# Patient Record
Sex: Male | Born: 1990 | Race: White | Hispanic: No | Smoking: Current every day smoker
Health system: Southern US, Community
[De-identification: ages and names within clinical notes are randomized; demographics above are authoritative.]

## PROBLEM LIST (undated history)

## (undated) ENCOUNTER — Emergency Department (HOSPITAL_COMMUNITY): Admission: EM | Payer: Self-pay | Source: Home / Self Care

## (undated) HISTORY — PX: APPENDECTOMY: SHX54

---
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
## 2010-05-06 ENCOUNTER — Emergency Department: Payer: Self-pay | Admitting: Emergency Medicine

## 2010-06-11 ENCOUNTER — Emergency Department: Payer: Self-pay | Admitting: Emergency Medicine

## 2011-05-28 ENCOUNTER — Emergency Department: Payer: Self-pay | Admitting: Emergency Medicine

## 2012-04-20 ENCOUNTER — Emergency Department: Payer: Self-pay | Admitting: Emergency Medicine

## 2014-08-27 ENCOUNTER — Encounter (HOSPITAL_COMMUNITY): Payer: Self-pay | Admitting: *Deleted

## 2014-08-27 ENCOUNTER — Emergency Department (HOSPITAL_COMMUNITY)
Admission: EM | Admit: 2014-08-27 | Discharge: 2014-08-27 | Disposition: A | Discharge status: Home / Self Care | Payer: Self-pay | Attending: Emergency Medicine | Admitting: Emergency Medicine

## 2014-08-27 DIAGNOSIS — Z720 Tobacco use: Secondary | ICD-10-CM | POA: Insufficient documentation

## 2014-08-27 DIAGNOSIS — L0291 Cutaneous abscess, unspecified: Secondary | ICD-10-CM

## 2014-08-27 DIAGNOSIS — L02511 Cutaneous abscess of right hand: Secondary | ICD-10-CM | POA: Insufficient documentation

## 2014-08-27 MED ORDER — SULFAMETHOXAZOLE-TRIMETHOPRIM 800-160 MG PO TABS: 1.0000 | ORAL_TABLET | Freq: Two times a day (BID) | ORAL | Status: AC

## 2014-08-27 MED ORDER — LIDOCAINE HCL (PF) 2 % IJ SOLN
0.0000 mL | Freq: Once | INTRAMUSCULAR | Status: AC | PRN
  Administered 2014-08-27: 10 mL via INTRADERMAL
  Filled 2014-08-27: qty 10

## 2014-08-27 NOTE — ED Notes (Signed)
Patient c/o pain in finger states he noticed red swollen area yest.

## 2014-08-27 NOTE — ED Notes (Signed)
Refused wheelchair 

## 2014-08-27 NOTE — Discharge Instructions (Signed)
Return to the emergency room with worsening of symptoms, new symptoms or with symptoms that are concerning, especially fevers, chills, increased swelling, redness, pain, red streaks, pus discharge. Warm soaks three times a day, you may cover area with gauze. Please take all of your antibiotics until finished!   You may develop abdominal discomfort or diarrhea from the antibiotic.  You may help offset this with probiotics which you can buy or get in yogurt. Do not eat  or take the probiotics until 2 hours after your antibiotic.    Abscess Care After An abscess (also called a boil or furuncle) is an infected area that contains a collection of pus. Signs and symptoms of an abscess include pain, tenderness, redness, or hardness, or you may feel a moveable soft area under your skin. An abscess can occur anywhere in the body. The infection may spread to surrounding tissues causing cellulitis. A cut (incision) by the surgeon was made over your abscess and the pus was drained out. Gauze may have been packed into the space to provide a drain that will allow the cavity to heal from the inside outwards. The boil may be painful for 5 to 7 days. Most people with a boil do not have high fevers. Your abscess, if seen early, may not have localized, and may not have been lanced. If not, another appointment may be required for this if it does not get better on its own or with medications. HOME CARE INSTRUCTIONS   Only take over-the-counter or prescription medicines for pain, discomfort, or fever as directed by your caregiver.  When you bathe, soak and then remove gauze or iodoform packs at least daily or as directed by your caregiver. You may then wash the wound gently with mild soapy water. Repack with gauze or do as your caregiver directs. SEEK IMMEDIATE MEDICAL CARE IF:   You develop increased pain, swelling, redness, drainage, or bleeding in the wound site.  You develop signs of generalized infection including  muscle aches, chills, fever, or a general ill feeling.  An oral temperature above 102 F (38.9 C) develops, not controlled by medication. See your caregiver for a recheck if you develop any of the symptoms described above. If medications (antibiotics) were prescribed, take them as directed. Document Released: 04/16/2005 Document Revised: 12/21/2011 Document Reviewed: 12/12/2007 [facility] Patient Information 2015 [employer]Care, [place]. This information is not intended to replace advice given to you by your health care provider. Make sure you discuss any questions you have with your health care provider.

## 2014-08-27 NOTE — ED Provider Notes (Signed)
CSN: [id number]     Arrival date & time 08/27/14  1519 History  This chart was scribed for non-physician practitioner, Oswaldo ConroyVictoria Takasha Vetere, PA-C, working with Purvis SheffieldForrest Harrison, MD, by Modena JanskyAlbert Thayil, ED Scribe. This patient was seen in room TR05C/TR05C and the patient's care was started at 5:31 PM.  Chief Complaint  Patient presents with  . Abscess   The history is provided by the patient. No language interpreter was used.    HPI Comments: Colton DowseShawn W Hicks is a 23 y.o. male who presents to the Emergency Department complaining of an abscess on his right middle finger that started 5 days ago. He states that the abscess started off as a lump and has increased in size without any discharge. He reports that the abscess has some surrounding erythema. He reports that he has not been outside or had any insect bites. He states that he has been using hydrocortisone without any relief. He reports no prior hx of abscess. He denies any fever, chills, nausea, or emesis.  No history of diabetes.  History reviewed. No pertinent past medical history. No past surgical history on file. History reviewed. No pertinent family history. History  Substance Use Topics  . Smoking status: Current Every Day Smoker  . Smokeless tobacco: Not on file  . Alcohol Use: Not on file    Review of Systems  Constitutional: Negative for fever and chills.  Gastrointestinal: Negative for nausea and vomiting.  Skin: Positive for rash.  All other systems reviewed and are negative.     Allergies  Review of patient's allergies indicates no known allergies.  Home Medications   Prior to Admission medications   Medication Sig Start Date End Date Taking? Authorizing Provider  sulfamethoxazole-trimethoprim (BACTRIM DS,SEPTRA DS) 800-160 MG per tablet Take 1 tablet by mouth 2 (two) times daily. 08/27/14 09/03/14  Benetta SparVictoria L Sammuel Blick, PA-C   BP 136/83 mmHg  Pulse 66  Temp(Src) 97.6 F (36.4 C) (Oral)  Resp 18  Ht 5\' 9"  (1.753 m)  Wt  138 lb (62.596 kg)  BMI 20.37 kg/m2  SpO2 96% Physical Exam  Constitutional: He appears well-developed and well-nourished. No distress.  HENT:  Head: Normocephalic and atraumatic.  Eyes: Conjunctivae are normal. Right eye exhibits no discharge. Left eye exhibits no discharge.  Pulmonary/Chest: Effort normal. No respiratory distress.  Neurological: He is alert. Coordination normal.  Skin: He is not diaphoretic.  1-2 cm fluctuant mass to right middle finger distal to MCP joint. Mild surrounding erythema. Full flexion and extension. +2 radial pulses bilaterally. <2 cap. refill.   Nursing note and vitals reviewed.   ED Course  Procedures (including critical care time) DIAGNOSTIC STUDIES: Oxygen Saturation is 96% on RA, normal by my interpretation.    COORDINATION OF CARE: 5:35 PM- Pt advised of plan for treatment which includes medication and pt agrees.  Labs Review Labs Reviewed - No data to display  Imaging Review No results found.   EKG Interpretation None      INCISION AND DRAINAGE Performed by: Louann SjogrenVictoria L Takayla Baillie Consent: Verbal consent obtained. Risks and benefits: risks, benefits and alternatives were discussed Type: abscess  Body area: right dorsal middle finger  Anesthesia: local infiltration  Incision was made with a scalpel.  Local anesthetic: lidocaine 2% w/o epinephrine  Anesthetic total: 2 ml  Complexity: complex Blunt dissection to break up loculations  Drainage: purulent  Drainage amount: 1-2cc  Packing material: none  Patient tolerance: Patient tolerated the procedure well with no immediate complications.  Meds given in ED:  Medications  lidocaine (XYLOCAINE) 2 % injection 0-20 mL (10 mLs Intradermal Given 08/27/14 1806)    Discharge Medication List as of 08/27/2014  6:54 PM    START taking these medications   Details  sulfamethoxazole-trimethoprim (BACTRIM DS,SEPTRA DS) 800-160 MG per tablet Take 1 tablet by mouth 2 (two) times  daily., Starting 08/27/2014, Until Mon 09/03/14, Print          MDM   Final diagnoses:  Abscess   Patient with skin abscess amenable to incision and drainage.  Abscess was not large enough to warrant packing or drain,  wound recheck with urgent care as needed. Encouraged home warm soaks and flushing.  Mild signs of cellulitis is surrounding skin.  Will d/c to home.  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 abx provided.   Discussed return precautions with patient. Discussed all results and patient verbalizes understanding and agrees with plan.  I personally performed the services described in this documentation, which was scribed in my presence. The recorded information has been reviewed and is accurate.     Louann SjogrenVictoria L Asher Babilonia, PA-C 08/28/14 1220  Purvis SheffieldForrest Harrison, MD 08/28/14 1450

## 2014-08-27 NOTE — ED Notes (Signed)
Pt in c/o possible abscess to right middle finger for the last few days

## 2015-04-07 ENCOUNTER — Encounter (HOSPITAL_COMMUNITY): Payer: Self-pay | Admitting: Emergency Medicine

## 2015-04-07 ENCOUNTER — Emergency Department (HOSPITAL_COMMUNITY)
Admission: EM | Admit: 2015-04-07 | Discharge: 2015-04-07 | Disposition: A | Discharge status: Home / Self Care | Payer: Self-pay | Attending: Emergency Medicine | Admitting: Emergency Medicine

## 2015-04-07 DIAGNOSIS — M436 Torticollis: Secondary | ICD-10-CM | POA: Insufficient documentation

## 2015-04-07 DIAGNOSIS — Z720 Tobacco use: Secondary | ICD-10-CM | POA: Insufficient documentation

## 2015-04-07 MED ORDER — METHOCARBAMOL 500 MG PO TABS: 500.0000 mg | ORAL_TABLET | Freq: Two times a day (BID) | ORAL | Status: DC

## 2015-04-07 MED ORDER — NAPROXEN 500 MG PO TABS: 500.0000 mg | ORAL_TABLET | Freq: Two times a day (BID) | ORAL | Status: DC

## 2015-04-07 MED ORDER — METHOCARBAMOL 500 MG PO TABS
500.0000 mg | ORAL_TABLET | Freq: Once | ORAL | Status: AC
  Administered 2015-04-07: 500 mg via ORAL
  Filled 2015-04-07: qty 1

## 2015-04-07 NOTE — Discharge Instructions (Signed)
Please follow up with your primary care physician in 1-2 days. If you do not have one please call the [facility] number listed above. Please take pain medication and/or muscle relaxants as prescribed and as needed for pain. Please do not drive on narcotic pain medication or on muscle relaxants. Please read all discharge instructions and return precautions.  ° °Torticollis, Acute °You have suddenly (acutely) developed a twisted neck (torticollis). This is usually a self-limited condition. °CAUSES  °Acute torticollis may be caused by malposition, trauma or infection. Most commonly, acute torticollis is caused by sleeping in an awkward position. Torticollis may also be caused by the flexion, extension or twisting of the neck muscles beyond their normal position. Sometimes, the exact cause may not be known. °SYMPTOMS  °Usually, there is pain and limited movement of the neck. Your neck may twist to one side. °DIAGNOSIS  °The diagnosis is often made by physical examination. X-rays, CT scans or MRIs may be done if there is a history of trauma or concern of infection. °TREATMENT  °For a common, stiff neck that develops during sleep, treatment is focused on relaxing the contracted neck muscle. Medications (including shots) may be used to treat the problem. Most cases resolve in several days. Torticollis usually responds to conservative physical therapy. If left untreated, the shortened and spastic neck muscle can cause deformities in the face and neck. Rarely, surgery is required. °HOME CARE INSTRUCTIONS  °· Use over-the-counter and prescription medications as directed by your caregiver. °· Do stretching exercises and massage the neck as directed by your caregiver. °· Follow up with physical therapy if needed and as directed by your caregiver. °SEEK IMMEDIATE MEDICAL CARE IF:  °· You develop difficulty breathing or noisy breathing (stridor). °· You drool, develop trouble swallowing or have pain with  swallowing. °· You develop numbness or weakness in the hands or feet. °· You have changes in speech or vision. °· You have problems with urination or bowel movements. °· You have difficulty walking. °· You have a fever. °· You have increased pain. °MAKE SURE YOU:  °· Understand these instructions. °· Will watch your condition. °· Will get help right away if you are not doing well or get worse. °Document Released: 09/25/2000 Document Revised: 12/21/2011 Document Reviewed: 11/06/2009 °ExitCare® Patient Information ©2015 Exit[employer], LLC. This information is not intended to replace advice given to you by your health care provider. Make sure you discuss any questions you have with your health care provider. ° °

## 2015-04-07 NOTE — ED Notes (Signed)
C/o pain to posterior neck that he woke up with yesterday morning.  No known injury.

## 2015-04-07 NOTE — ED Provider Notes (Signed)
History  This chart was scribed for non-physician practitioner, Francee Piccolo, PA-C,working with Elwin Mocha, MD, by Karle Plumber, ED Scribe. This patient was seen in room TR06C/TR06C and the patient's care was started at 5:03 PM.  Chief Complaint  Patient presents with  . Neck Pain   The history is provided by the patient and medical records. No language interpreter was used.    HPI Comments:  Colton Hicks is a 24 y.o. male who presents to the Emergency Department complaining of severe posterior neck pain that radiates to bilateral shoulders and upper back that began yesterday upon waking. He states he has had similar symptoms in the past that resolved on its own. He states he has taken Ibuprofen with no significant relief of the pain. Pt denies modifying factors of the symptoms. Denies fever, chills, rashes, nausea, vomiting, numbness, tingling or weakness of the upper extremities, trauma, injury or fall. He denies any known tick bites.  History reviewed. No pertinent past medical history. History reviewed. No pertinent past surgical history. No family history on file. History  Substance Use Topics  . Smoking status: Current Every Day Smoker  . Smokeless tobacco: Not on file  . Alcohol Use: No    Review of Systems  Constitutional: Negative for fever and chills.  Gastrointestinal: Negative for nausea and vomiting.  Musculoskeletal: Positive for myalgias and neck pain.  Skin: Negative for rash and wound.  Neurological: Negative for weakness and numbness.  All other systems reviewed and are negative.   Allergies  Review of patient's allergies indicates no known allergies.  Home Medications   Prior to Admission medications   Medication Sig Start Date End Date Taking? Authorizing Provider  methocarbamol (ROBAXIN) 500 MG tablet Take 1 tablet (500 mg total) by mouth 2 (two) times daily. 04/07/15   Mahsa Hanser, PA-C  naproxen (NAPROSYN) 500 MG tablet Take 1  tablet (500 mg total) by mouth 2 (two) times daily with a meal. 04/07/15   Francee Piccolo, PA-C   Triage Vitals: BP 128/82 mmHg  Pulse 58  Temp(Src) 98.1 F (36.7 C) (Oral)  Resp 16  Ht  (1.727 m)  Wt 139 lb (63.05 kg)  BMI 21.14 kg/m2  SpO2 97% Physical Exam  Constitutional: He is oriented to person, place, and time. He appears well-developed and well-nourished. No distress.  HENT:  Head: Normocephalic and atraumatic.  Right Ear: External ear normal.  Left Ear: External ear normal.  Nose: Nose normal.  Mouth/Throat: Oropharynx is clear and moist.  Eyes: Conjunctivae and EOM are normal. Pupils are equal, round, and reactive to light.  Neck: Normal range of motion. Neck supple. Muscular tenderness present. No spinous process tenderness present.  No nuchal rigidity.   Cardiovascular: Normal rate, regular rhythm and normal heart sounds.   Pulmonary/Chest: Effort normal.  Abdominal: Soft.  Musculoskeletal: Normal range of motion.       Cervical back: He exhibits spasm. He exhibits no bony tenderness and no deformity.  Neurological: He is alert and oriented to person, place, and time.  Skin: Skin is warm and dry. He is not diaphoretic.  Psychiatric: He has a normal mood and affect.  Nursing note and vitals reviewed.   ED Course  Procedures (including critical care time) Medications  methocarbamol (ROBAXIN) tablet 500 mg (500 mg Oral Given 04/07/15 1719)    DIAGNOSTIC STUDIES: Oxygen Saturation is 97% on RA, normal by my interpretation.   COORDINATION OF CARE: 5:06 PM- Will prescribe muscle relaxer and encouraged pt to  continue Ibuprofen. Will order first dose here. Pt verbalizes understanding and agrees to plan.  Medications  methocarbamol (ROBAXIN) tablet 500 mg (500 mg Oral Given 04/07/15 1719)   Labs Review Labs Reviewed - No data to display  Imaging Review No results found.   EKG Interpretation None      MDM   Final diagnoses:  Torticollis     Filed Vitals:   04/07/15 1652  BP: 128/82  Pulse: 58  Temp: 98.1 F (36.7 C)  Resp: 16   Afebrile, NAD, non-toxic appearing, AAOx4.  Patient presenting for bilateral neck pain with muscle spasm noted. No cervical spine tenderness or deformity. Pain likely related to muscle spasm. Will prescribe muscle relaxer. Return precautions discussed. Patient is agreeable to plan. Patient is stable at time of discharge   I personally performed the services described in this documentation, which was scribed in my presence. The recorded information has been reviewed and is accurate.  Francee Piccolo, PA-C 04/07/15 1726  Elwin Mocha, MD 04/07/15 2329

## 2015-10-19 ENCOUNTER — Emergency Department (HOSPITAL_COMMUNITY): Payer: 59

## 2015-10-19 ENCOUNTER — Emergency Department (HOSPITAL_COMMUNITY)
Admission: EM | Admit: 2015-10-19 | Discharge: 2015-10-19 | Disposition: A | Discharge status: Home / Self Care | Payer: 59 | Attending: Emergency Medicine | Admitting: Emergency Medicine

## 2015-10-19 ENCOUNTER — Encounter (HOSPITAL_COMMUNITY): Payer: Self-pay | Admitting: *Deleted

## 2015-10-19 DIAGNOSIS — R091 Pleurisy: Secondary | ICD-10-CM | POA: Insufficient documentation

## 2015-10-19 DIAGNOSIS — R1110 Vomiting, unspecified: Secondary | ICD-10-CM | POA: Diagnosis present

## 2015-10-19 DIAGNOSIS — K529 Noninfective gastroenteritis and colitis, unspecified: Secondary | ICD-10-CM | POA: Insufficient documentation

## 2015-10-19 DIAGNOSIS — R1111 Vomiting without nausea: Secondary | ICD-10-CM | POA: Diagnosis not present

## 2015-10-19 DIAGNOSIS — F17200 Nicotine dependence, unspecified, uncomplicated: Secondary | ICD-10-CM | POA: Diagnosis not present

## 2015-10-19 DIAGNOSIS — K2970 Gastritis, unspecified, without bleeding: Secondary | ICD-10-CM | POA: Diagnosis not present

## 2015-10-19 DIAGNOSIS — R109 Unspecified abdominal pain: Secondary | ICD-10-CM | POA: Diagnosis not present

## 2015-10-19 DIAGNOSIS — R079 Chest pain, unspecified: Secondary | ICD-10-CM | POA: Diagnosis not present

## 2015-10-19 LAB — CBC WITH DIFFERENTIAL/PLATELET
Basophils Absolute: 0 10*3/uL (ref 0.0–0.1)
Basophils Relative: 0 %
Eosinophils Absolute: 0.1 10*3/uL (ref 0.0–0.7)
Eosinophils Relative: 1 %
HEMATOCRIT: 49.6 % (ref 39.0–52.0)
HEMOGLOBIN: 17.7 g/dL — AB (ref 13.0–17.0)
LYMPHS ABS: 0.6 10*3/uL — AB (ref 0.7–4.0)
LYMPHS PCT: 4 %
MCH: 29.1 pg (ref 26.0–34.0)
MCHC: 35.7 g/dL (ref 30.0–36.0)
MCV: 81.6 fL (ref 78.0–100.0)
Monocytes Absolute: 0.7 10*3/uL (ref 0.1–1.0)
Monocytes Relative: 5 %
NEUTROS ABS: 12.9 10*3/uL — AB (ref 1.7–7.7)
Neutrophils Relative %: 90 %
PLATELETS: 199 10*3/uL (ref 150–400)
RBC: 6.08 MIL/uL — ABNORMAL HIGH (ref 4.22–5.81)
RDW: 12.4 % (ref 11.5–15.5)
WBC: 14.3 10*3/uL — ABNORMAL HIGH (ref 4.0–10.5)

## 2015-10-19 LAB — COMPREHENSIVE METABOLIC PANEL
ALT: 32 U/L (ref 17–63)
AST: 30 U/L (ref 15–41)
Albumin: 4.9 g/dL (ref 3.5–5.0)
Alkaline Phosphatase: 48 U/L (ref 38–126)
Anion gap: 14 (ref 5–15)
BUN: 13 mg/dL (ref 6–20)
CHLORIDE: 103 mmol/L (ref 101–111)
CO2: 24 mmol/L (ref 22–32)
Calcium: 9.6 mg/dL (ref 8.9–10.3)
Creatinine, Ser: 1.13 mg/dL (ref 0.61–1.24)
GFR calc Af Amer: >60 mL/min (ref >60)
Glucose, Bld: 100 mg/dL — ABNORMAL HIGH (ref 65–99)
POTASSIUM: 3.7 mmol/L (ref 3.5–5.1)
Sodium: 141 mmol/L (ref 135–145)
Total Bilirubin: 1.7 mg/dL — ABNORMAL HIGH (ref 0.3–1.2)
Total Protein: 7.1 g/dL (ref 6.5–8.1)

## 2015-10-19 LAB — URINALYSIS, ROUTINE W REFLEX MICROSCOPIC
GLUCOSE, UA: NEGATIVE mg/dL
Hgb urine dipstick: NEGATIVE
Ketones, ur: 40 mg/dL — AB
LEUKOCYTES UA: NEGATIVE
Nitrite: NEGATIVE
Protein, ur: NEGATIVE mg/dL
Specific Gravity, Urine: 1.028 (ref 1.005–1.030)
pH: 5.5 (ref 5.0–8.0)

## 2015-10-19 LAB — LIPASE, BLOOD: LIPASE: 30 U/L (ref 11–51)

## 2015-10-19 MED ORDER — HYDROCODONE-ACETAMINOPHEN 5-325 MG PO TABS
1.0000 | ORAL_TABLET | Freq: Once | ORAL | Status: AC
  Administered 2015-10-19: 1 via ORAL
  Filled 2015-10-19: qty 1

## 2015-10-19 MED ORDER — KETOROLAC TROMETHAMINE 30 MG/ML IJ SOLN
30.0000 mg | Freq: Once | INTRAMUSCULAR | Status: AC
  Administered 2015-10-19: 30 mg via INTRAVENOUS
  Filled 2015-10-19: qty 1

## 2015-10-19 MED ORDER — ONDANSETRON 4 MG PO TBDP: 4.0000 mg | ORAL_TABLET | Freq: Once | ORAL | Status: DC

## 2015-10-19 MED ORDER — ONDANSETRON 4 MG PO TBDP
4.0000 mg | ORAL_TABLET | Freq: Once | ORAL | Status: AC
  Administered 2015-10-19: 4 mg via ORAL
  Filled 2015-10-19: qty 1

## 2015-10-19 MED ORDER — NAPROXEN 375 MG PO TABS: 375.0000 mg | ORAL_TABLET | Freq: Two times a day (BID) | ORAL | Status: DC

## 2015-10-19 NOTE — ED Provider Notes (Signed)
CSN: [id number]     Arrival date & time 10/19/15  0347 History  By signing my name below, I, Bethel Born, attest that this documentation has been prepared under the direction and in the presence of Derwood Kaplan, MD. Electronically Signed: Bethel Born, ED Scribe. 10/19/2015. 4:08 AM   Chief Complaint  Patient presents with  . Emesis     The history is provided by the patient. No language interpreter was used.   Brought in by EMS, Colton Hicks is a 25 y.o. male who presents to the Emergency Department complaining of new, atraumatic, constant, diffuse abdominal pain with onset 10 hours ago. Associated symptoms include N/V/D and extremity cramping. Pt notes 3 episodes of loose stool and 6-7 episodes of yellow/white emesis. Pt denies hematemesis and hematochezia. He denies eating anything suspicious. He has no relevant surgical history. Pt is otherwise healthy and takes no daily medication. He denies tobacco and heavy alcohol use.   History reviewed. No pertinent past medical history. History reviewed. No pertinent past surgical history. No family history on file. Social History  Substance Use Topics  . Smoking status: Current Every Day Smoker  . Smokeless tobacco: None  . Alcohol Use: No    Review of Systems  Gastrointestinal: Positive for nausea, vomiting, abdominal pain and diarrhea.  Musculoskeletal: Positive for myalgias.   10 Systems reviewed and all are negative for acute change except as noted in the HPI.  Allergies  Review of patient's allergies indicates no known allergies.  Home Medications   Prior to Admission medications   Medication Sig Start Date End Date Taking? Authorizing Provider  naproxen (NAPROSYN) 375 MG tablet Take 1 tablet (375 mg total) by mouth 2 (two) times daily. 10/19/15   Derwood Kaplan, MD  ondansetron (ZOFRAN-ODT) 4 MG disintegrating tablet Take 1 tablet (4 mg total) by mouth once. 10/19/15   Larson Limones, MD   BP 120/101 mmHg  Pulse 82   Temp(Src) 97.8 F (36.6 C)  Resp 24  Ht 5\' 9"  (1.753 m)  Wt 140 lb (63.504 kg)  BMI 20.67 kg/m2  SpO2 100% Physical Exam  Constitutional: He is oriented to person, place, and time. He appears well-developed and well-nourished.  HENT:  Head: Normocephalic and atraumatic.  Moist mucous membranes.  Eyes: EOM are normal.  Neck: Normal range of motion.  Cardiovascular: Normal rate, regular rhythm, normal heart sounds and intact distal pulses.   Pulmonary/Chest: Effort normal and breath sounds normal. No respiratory distress.  Abdominal: Soft. He exhibits no distension. Bowel sounds are increased. There is tenderness.  Tenderness over the LUQ and LLQ. No flank tenderness.  Musculoskeletal: Normal range of motion.  Neurological: He is alert and oriented to person, place, and time.  Skin: Skin is warm and dry.  Capillary refill less than 3 seconds.   Psychiatric: He has a normal mood and affect. Judgment normal.  Nursing note and vitals reviewed.   ED Course  Procedures (including critical care time) DIAGNOSTIC STUDIES: Oxygen Saturation is 100% on RA,  normal by my interpretation.    COORDINATION OF CARE: 4:02 AM Discussed treatment plan with pt at bedside and pt agreed to plan.  Labs Review Labs Reviewed  CBC WITH DIFFERENTIAL/PLATELET - Abnormal; Notable for the following:    WBC 14.3 (*)    RBC 6.08 (*)    Hemoglobin 17.7 (*)    Neutro Abs 12.9 (*)    Lymphs Abs 0.6 (*)    All other components within normal limits  COMPREHENSIVE METABOLIC  PANEL - Abnormal; Notable for the following:    Glucose, Bld 100 (*)    Total Bilirubin 1.7 (*)    All other components within normal limits  URINALYSIS, ROUTINE W REFLEX MICROSCOPIC (NOT [facility]) - Abnormal; Notable for the following:    Color, Urine AMBER (*)    Bilirubin Urine SMALL (*)    Ketones, ur 40 (*)    All other components within normal limits  LIPASE, BLOOD    Imaging Review Dg Abd Acute W/chest  10/19/2015  CLINICAL  DATA:  Chest and abdominal pain, nausea and vomiting, diarrhea beginning yesterday. EXAM: DG ABDOMEN ACUTE W/ 1V CHEST COMPARISON:  None. FINDINGS: Cardiomediastinal silhouette is normal. Lungs are clear, no pleural effusions. No pneumothorax. Soft tissue planes and included osseous structures are nonacute; dystrophic calcifications projecting at base of neck. Bowel gas pattern is nondilated and nonobstructive. No intra-abdominal mass effect, pathologic calcifications or free air. Soft tissue planes and included osseous structures are non-suspicious. IMPRESSION: Negative abdominal radiographs. No acute cardiopulmonary process. Dystrophic calcifications projecting at base of neck could be external to the patient or, possibly within the thyroid gland. Electronically Signed   By: Awilda Metroourtnay  Bloomer M.D.   On: 10/19/2015 05:04      EKG Interpretation None      MDM   Final diagnoses:  Pleurisy  Gastroenteritis    I personally performed the services described in this documentation, which was scribed in my presence. The recorded information has been reviewed and is accurate.  Young man with no medical hx, cardiopulm hx comes in with chest pain and abd pain. - Exam is non peritoneal. N/v/abd pain and diarrhea - likely gastro. Appears slightly dry. Oral challenge initiated. - Pt has no hx of PE, DVT and denies any exogenous estrogen use, long distance travels or surgery in the past 6 weeks, active cancer, recent immobilization. Pain is pleuritic. No cough however. AAS ordered - and shows no obstructive pathology or infiltrates. Pt is PERC neg. Will tx as pleurisy or costochondritis for the right sided chest pain.    Derwood KaplanAnkit Laniya Friedl, MD 10/21/15 [phone]

## 2015-10-19 NOTE — Discharge Instructions (Signed)
We think you have a stomach flu.  Take the nausea medicine, hydrate well and start on clear liquid diet. Please return to the ER if you have bloody stools, fevers.  You also complained rib pain on the right side, worse with breathing. All of our cardiac workup is normal, including labs, EKG and chest X-RAY are normal. We are not sure what is causing your discomfort, but we feel comfortable sending you home at this time.  Please return to the ER if you have worsening chest pain, shortness of breath.   Pleurisy Pleurisy is an inflammation and swelling of the lining of the lungs (pleura). Because of this inflammation, it hurts to breathe. It can be aggravated by coughing, laughing, or deep breathing. Pleurisy is often caused by an underlying infection or disease.  HOME CARE INSTRUCTIONS  Monitor your pleurisy for any changes. The following actions may help to alleviate any discomfort you are experiencing:  Medicine may help with pain. Only take over-the-counter or prescription medicines for pain, discomfort, or fever as directed by your health care provider.  Only take antibiotic medicine as directed. Make sure to finish it even if you start to feel better. SEEK MEDICAL CARE IF:   Your pain is not controlled with medicine or is increasing.  You have an increase in pus-like (purulent) secretions brought up with coughing. SEEK IMMEDIATE MEDICAL CARE IF:   You have blue or dark lips, fingernails, or toenails.  You are coughing up blood.  You have increased difficulty breathing.  You have continuing pain unrelieved by medicine or pain lasting more than 1 week.  You have pain that radiates into your neck, arms, or jaw.  You develop increased shortness of breath or wheezing.  You develop a fever, rash, vomiting, fainting, or other serious symptoms. MAKE SURE YOU:  Understand these instructions.   Will watch your condition.   Will get help right away if you are not doing well or  get worse.    This information is not intended to replace advice given to you by your health care provider. Make sure you discuss any questions you have with your health care provider.   Document Released: 09/28/2005 Document Revised: 05/31/2013 Document Reviewed: 03/12/2013 Elsevier Interactive Patient Education 2016 [employer] Viral Gastroenteritis Viral gastroenteritis is also known as stomach flu. This condition affects the stomach and intestinal tract. It can cause sudden diarrhea and vomiting. The illness typically lasts 3 to 8 days. Most people develop an immune response that eventually gets rid of the virus. While this natural response develops, the virus can make you quite ill. CAUSES  Many different viruses can cause gastroenteritis, such as rotavirus or noroviruses. You can catch one of these viruses by consuming contaminated food or water. You may also catch a virus by sharing utensils or other personal items with an infected person or by touching a contaminated surface. SYMPTOMS  The most common symptoms are diarrhea and vomiting. These problems can cause a severe loss of body fluids (dehydration) and a body salt (electrolyte) imbalance. Other symptoms may include:  Fever.  Headache.  Fatigue.  Abdominal pain. DIAGNOSIS  Your caregiver can usually diagnose viral gastroenteritis based on your symptoms and a physical exam. A stool sample may also be taken to test for the presence of viruses or other infections. TREATMENT  This illness typically goes away on its own. Treatments are aimed at rehydration. The most serious cases of viral gastroenteritis involve vomiting so severely that you are not able  to keep fluids down. In these cases, fluids must be given through an intravenous line (IV). HOME CARE INSTRUCTIONS   Drink enough fluids to keep your urine clear or pale yellow. Drink small amounts of fluids frequently and increase the amounts as tolerated.  Ask your caregiver  for specific rehydration instructions.  Avoid:  Foods high in sugar.  Alcohol.  Carbonated drinks.  Tobacco.  Juice.  Caffeine drinks.  Extremely hot or cold fluids.  Fatty, greasy foods.  Too much intake of anything at one time.  Dairy products until 24 to 48 hours after diarrhea stops.  You may consume probiotics. Probiotics are active cultures of beneficial bacteria. They may lessen the amount and number of diarrheal stools in adults. Probiotics can be found in yogurt with active cultures and in supplements.  Wash your hands well to avoid spreading the virus.  Only take over-the-counter or prescription medicines for pain, discomfort, or fever as directed by your caregiver. Do not give aspirin to children. Antidiarrheal medicines are not recommended.  Ask your caregiver if you should continue to take your regular prescribed and over-the-counter medicines.  Keep all follow-up appointments as directed by your caregiver. SEEK IMMEDIATE MEDICAL CARE IF:   You are unable to keep fluids down.  You do not urinate at least once every 6 to 8 hours.  You develop shortness of breath.  You notice blood in your stool or vomit. This may look like coffee grounds.  You have abdominal pain that increases or is concentrated in one small area (localized).  You have persistent vomiting or diarrhea.  You have a fever.  The patient is a child younger than 3 months, and he or she has a fever.  The patient is a child older than 3 months, and he or she has a fever and persistent symptoms.  The patient is a child older than 3 months, and he or she has a fever and symptoms suddenly get worse.  The patient is a baby, and he or she has no tears when crying. MAKE SURE YOU:   Understand these instructions.  Will watch your condition.  Will get help right away if you are not doing well or get worse.   This information is not intended to replace advice given to you by your health  care provider. Make sure you discuss any questions you have with your health care provider.   Document Released: 09/28/2005 Document Revised: 12/21/2011 Document Reviewed: 07/15/2011 Elsevier Interactive Patient Education [employer]

## 2015-10-19 NOTE — ED Notes (Signed)
The pt arrived by gems from home.  He has had nausea vomiting diarrhea since 1830 tonight.  Other people in household are ill.  Iv per ems  zofran 4mg  given by gems.  Pt alert no distress.  He is c/o some cramps in his hands rapid respirations.  cbg 120

## 2015-10-19 NOTE — ED Notes (Signed)
To x-ray

## 2016-10-19 ENCOUNTER — Ambulatory Visit (HOSPITAL_COMMUNITY)
Admission: EM | Admit: 2016-10-19 | Discharge: 2016-10-19 | Disposition: A | Discharge status: Home / Self Care | Payer: 59 | Attending: Emergency Medicine | Admitting: Emergency Medicine

## 2016-10-19 ENCOUNTER — Encounter (HOSPITAL_COMMUNITY): Payer: Self-pay | Admitting: Emergency Medicine

## 2016-10-19 DIAGNOSIS — N509 Disorder of male genital organs, unspecified: Secondary | ICD-10-CM | POA: Diagnosis not present

## 2016-10-19 DIAGNOSIS — N5089 Other specified disorders of the male genital organs: Secondary | ICD-10-CM

## 2016-10-19 MED ORDER — DOXYCYCLINE HYCLATE 100 MG PO CAPS: 100.0000 mg | ORAL_CAPSULE | Freq: Two times a day (BID) | ORAL | 0 refills | Status: DC

## 2016-10-19 MED ORDER — HYDROCODONE-ACETAMINOPHEN 5-325 MG PO TABS: 2.0000 | ORAL_TABLET | ORAL | 0 refills | Status: DC | PRN

## 2016-10-19 NOTE — ED Triage Notes (Signed)
[facility] a [facility] possible abscess in his groin area that has been there for 4 days.

## 2016-10-19 NOTE — ED Provider Notes (Signed)
CSN: [id number]     Arrival date & time 10/19/16  1535 History   None    Chief Complaint  Patient presents with  . Abscess   (Consider location/radiation/quality/duration/timing/severity/associated sxs/prior Treatment) The history is provided by the patient. No language interpreter was used.  Abscess  Abscess location: scrotum. Size:  2 Abscess quality: painful   Red streaking: no   Progression:  Worsening Pain details:    Quality:  No pain   Severity:  No pain   Timing:  Constant   Progression:  Worsening Chronicity:  New Relieved by:  Nothing Worsened by:  Nothing Ineffective treatments:  None tried Risk factors: prior abscess   Pt complains of a swollen area on his anterior scrotum.  Pt reports he noticed 4 days ago.  Pt has had skin infections in the past  History reviewed. No pertinent past medical history. History reviewed. No pertinent surgical history. History reviewed. No pertinent family history. Social History  Substance Use Topics  . Smoking status: Current Every Day Smoker  . Smokeless tobacco: Never Used  . Alcohol use No    Review of Systems  All other systems reviewed and are negative.   Allergies  Patient has no known allergies.  Home Medications   Prior to Admission medications   Medication Sig Start Date End Date Taking? Authorizing Provider  doxycycline (VIBRAMYCIN) 100 MG capsule Take 1 capsule (100 mg total) by mouth 2 (two) times daily. 10/19/16   Elson Areas, PA-C  HYDROcodone-acetaminophen (NORCO/VICODIN) 5-325 MG tablet Take 2 tablets by mouth every 4 (four) hours as needed. 10/19/16   Elson Areas, PA-C   Meds Ordered and Administered this Visit  Medications - No data to display  BP 121/72 (BP Location: Left Arm)   Pulse 63   Temp 98.1 F (36.7 C) (Oral)   Resp 16   SpO2 99%  No data found.   Physical Exam  Constitutional: He is oriented to person, place, and time. He appears well-developed and well-nourished.  HENT:  Head:  Normocephalic.  Eyes: EOM are normal.  Neck: Normal range of motion.  Pulmonary/Chest: Effort normal.  Abdominal: He exhibits no distension.  Genitourinary:  Genitourinary Comments: 2cm firm area anterior mid upper scrotum,  frim to palpation  Musculoskeletal: Normal range of motion.  Neurological: He is alert and oriented to person, place, and time.  Skin: Skin is warm.  Psychiatric: He has a normal mood and affect.  Nursing note and vitals reviewed.   Urgent Care Course   Clinical Course     Procedures (including critical care time)  Labs Review Labs Reviewed - No data to display  Imaging Review No results found.   Visual Acuity Review  Right Eye Distance:   Left Eye Distance:   Bilateral Distance:    Right Eye Near:   Left Eye Near:    Bilateral Near:         MDM  Exam with Rn in room.  I counseled pt to schedule to see the Urologist.  Area may be infection.  Pt advised to soak area 20 minutes 4 times a day.     1. Scrotal mass     Meds ordered this encounter  Medications  . DISCONTD: doxycycline (VIBRAMYCIN) 100 MG capsule    Sig: Take 1 capsule (100 mg total) by mouth 2 (two) times daily.    Dispense:  20 capsule    Refill:  0    Order Specific Question:   Supervising Provider  AnswerCharm Rings:   HONIG, ERIN J [[id number]]  . HYDROcodone-acetaminophen (NORCO/VICODIN) 5-325 MG tablet    Sig: Take 2 tablets by mouth every 4 (four) hours as needed.    Dispense:  16 tablet    Refill:  0    Order Specific Question:   Supervising Provider    Answer:   Charm RingsHONIG, ERIN J [id number]]  . doxycycline (VIBRAMYCIN) 100 MG capsule    Sig: Take 1 capsule (100 mg total) by mouth 2 (two) times daily.    Dispense:  20 capsule    Refill:  0    Order Specific Question:   Supervising Provider    Answer:   Charm RingsHONIG, ERIN J [phone][[id number]]  An After Visit Summary was printed and given to the patient.   Lonia SkinnerLeslie K PittsvilleSofia, PA-C 10/19/16 [id number]

## 2016-11-14 ENCOUNTER — Encounter (HOSPITAL_COMMUNITY): Payer: Self-pay | Admitting: Emergency Medicine

## 2016-11-14 ENCOUNTER — Encounter (HOSPITAL_COMMUNITY)
Admission: EM | Disposition: A | Discharge status: Home / Self Care | Payer: Self-pay | Source: Home / Self Care | Attending: Emergency Medicine

## 2016-11-14 ENCOUNTER — Observation Stay (HOSPITAL_COMMUNITY)
Admission: EM | Admit: 2016-11-14 | Discharge: 2016-11-15 | Disposition: A | Discharge status: Home / Self Care | Payer: 59 | Attending: Surgery | Admitting: Surgery

## 2016-11-14 ENCOUNTER — Observation Stay (HOSPITAL_COMMUNITY): Payer: 59 | Admitting: Anesthesiology

## 2016-11-14 ENCOUNTER — Emergency Department (HOSPITAL_COMMUNITY): Payer: 59

## 2016-11-14 DIAGNOSIS — K381 Appendicular concretions: Secondary | ICD-10-CM | POA: Insufficient documentation

## 2016-11-14 DIAGNOSIS — R109 Unspecified abdominal pain: Secondary | ICD-10-CM | POA: Diagnosis not present

## 2016-11-14 DIAGNOSIS — R1110 Vomiting, unspecified: Secondary | ICD-10-CM | POA: Diagnosis not present

## 2016-11-14 DIAGNOSIS — K3580 Unspecified acute appendicitis: Principal | ICD-10-CM | POA: Diagnosis present

## 2016-11-14 DIAGNOSIS — F17210 Nicotine dependence, cigarettes, uncomplicated: Secondary | ICD-10-CM | POA: Diagnosis not present

## 2016-11-14 HISTORY — PX: LAPAROSCOPIC APPENDECTOMY: SHX408

## 2016-11-14 LAB — COMPREHENSIVE METABOLIC PANEL
ALK PHOS: 50 U/L (ref 38–126)
ALT: 30 U/L (ref 17–63)
AST: 26 U/L (ref 15–41)
Albumin: 4.6 g/dL (ref 3.5–5.0)
Anion gap: 11 (ref 5–15)
BILIRUBIN TOTAL: 0.7 mg/dL (ref 0.3–1.2)
BUN: 6 mg/dL (ref 6–20)
CALCIUM: 9.6 mg/dL (ref 8.9–10.3)
CO2: 22 mmol/L (ref 22–32)
CREATININE: 1.09 mg/dL (ref 0.61–1.24)
Chloride: 104 mmol/L (ref 101–111)
GFR calc non Af Amer: >60 mL/min (ref >60)
Glucose, Bld: 123 mg/dL — ABNORMAL HIGH (ref 65–99)
Potassium: 3.8 mmol/L (ref 3.5–5.1)
Sodium: 137 mmol/L (ref 135–145)
TOTAL PROTEIN: 7.3 g/dL (ref 6.5–8.1)

## 2016-11-14 LAB — CBC
HCT: 47 % (ref 39.0–52.0)
Hemoglobin: 16.2 g/dL (ref 13.0–17.0)
MCH: 27.6 pg (ref 26.0–34.0)
MCHC: 34.5 g/dL (ref 30.0–36.0)
MCV: 80.2 fL (ref 78.0–100.0)
Platelets: 207 10*3/uL (ref 150–400)
RBC: 5.86 MIL/uL — AB (ref 4.22–5.81)
RDW: 12.2 % (ref 11.5–15.5)
WBC: 9.2 10*3/uL (ref 4.0–10.5)

## 2016-11-14 LAB — LIPASE, BLOOD: Lipase: 28 U/L (ref 11–51)

## 2016-11-14 LAB — URINALYSIS, ROUTINE W REFLEX MICROSCOPIC
Bilirubin Urine: NEGATIVE
Glucose, UA: NEGATIVE mg/dL
Hgb urine dipstick: NEGATIVE
KETONES UR: 5 mg/dL — AB
LEUKOCYTES UA: NEGATIVE
Nitrite: NEGATIVE
PROTEIN: NEGATIVE mg/dL
Specific Gravity, Urine: 1.02 (ref 1.005–1.030)
pH: 7 (ref 5.0–8.0)

## 2016-11-14 SURGERY — APPENDECTOMY, LAPAROSCOPIC
Anesthesia: General | Site: Abdomen

## 2016-11-14 MED ORDER — DIPHENHYDRAMINE HCL 50 MG/ML IJ SOLN: 12.5000 mg | Freq: Four times a day (QID) | INTRAMUSCULAR | Status: DC | PRN

## 2016-11-14 MED ORDER — ENOXAPARIN SODIUM 40 MG/0.4ML ~~LOC~~ SOLN: 40.0000 mg | SUBCUTANEOUS | Status: DC

## 2016-11-14 MED ORDER — FENTANYL CITRATE (PF) 100 MCG/2ML IJ SOLN
INTRAMUSCULAR | Status: AC
  Filled 2016-11-14: qty 4

## 2016-11-14 MED ORDER — PROMETHAZINE HCL 25 MG/ML IJ SOLN: 6.2500 mg | INTRAMUSCULAR | Status: DC | PRN

## 2016-11-14 MED ORDER — ROCURONIUM BROMIDE 100 MG/10ML IV SOLN
INTRAVENOUS | Status: DC | PRN
  Administered 2016-11-14: 30 mg via INTRAVENOUS

## 2016-11-14 MED ORDER — ACETAMINOPHEN 650 MG RE SUPP: 650.0000 mg | Freq: Four times a day (QID) | RECTAL | Status: DC | PRN

## 2016-11-14 MED ORDER — SUGAMMADEX SODIUM 200 MG/2ML IV SOLN
INTRAVENOUS | Status: AC
  Filled 2016-11-14: qty 2

## 2016-11-14 MED ORDER — LACTATED RINGERS IV SOLN
INTRAVENOUS | Status: DC | PRN
  Administered 2016-11-14 (×2): via INTRAVENOUS

## 2016-11-14 MED ORDER — METRONIDAZOLE IN NACL 5-0.79 MG/ML-% IV SOLN
500.0000 mg | Freq: Three times a day (TID) | INTRAVENOUS | Status: DC
  Administered 2016-11-14 – 2016-11-15 (×2): 500 mg via INTRAVENOUS
  Filled 2016-11-14 (×3): qty 100

## 2016-11-14 MED ORDER — SUGAMMADEX SODIUM 200 MG/2ML IV SOLN
INTRAVENOUS | Status: DC | PRN
  Administered 2016-11-14: 200 mg via INTRAVENOUS

## 2016-11-14 MED ORDER — ARTIFICIAL TEARS OP OINT
TOPICAL_OINTMENT | OPHTHALMIC | Status: AC
  Filled 2016-11-14: qty 3.5

## 2016-11-14 MED ORDER — HYDROMORPHONE HCL 2 MG/ML IJ SOLN
0.5000 mg | INTRAMUSCULAR | Status: DC | PRN
  Administered 2016-11-14 – 2016-11-15 (×3): 0.5 mg via INTRAVENOUS
  Filled 2016-11-14 (×3): qty 1

## 2016-11-14 MED ORDER — SODIUM CHLORIDE 0.9 % IJ SOLN
INTRAMUSCULAR | Status: AC
  Filled 2016-11-14: qty 10

## 2016-11-14 MED ORDER — SUCCINYLCHOLINE CHLORIDE 20 MG/ML IJ SOLN
INTRAMUSCULAR | Status: DC | PRN
  Administered 2016-11-14: 100 mg via INTRAVENOUS

## 2016-11-14 MED ORDER — OXYCODONE HCL 5 MG PO TABS
5.0000 mg | ORAL_TABLET | Freq: Four times a day (QID) | ORAL | Status: DC | PRN
  Administered 2016-11-15: 5 mg via ORAL
  Filled 2016-11-14: qty 1

## 2016-11-14 MED ORDER — LIDOCAINE HCL (CARDIAC) 20 MG/ML IV SOLN
INTRAVENOUS | Status: DC | PRN
  Administered 2016-11-14: 100 mg via INTRATRACHEAL

## 2016-11-14 MED ORDER — IOPAMIDOL (ISOVUE-300) INJECTION 61%
INTRAVENOUS | Status: AC
  Administered 2016-11-14: 100 mL
  Filled 2016-11-14: qty 100

## 2016-11-14 MED ORDER — SODIUM CHLORIDE 0.9 % IV BOLUS (SEPSIS)
1000.0000 mL | Freq: Once | INTRAVENOUS | Status: AC
  Administered 2016-11-14: 1000 mL via INTRAVENOUS

## 2016-11-14 MED ORDER — DIPHENHYDRAMINE HCL 50 MG/ML IJ SOLN
INTRAMUSCULAR | Status: AC
  Filled 2016-11-14: qty 1

## 2016-11-14 MED ORDER — LIDOCAINE 2% (20 MG/ML) 5 ML SYRINGE
INTRAMUSCULAR | Status: AC
  Filled 2016-11-14: qty 5

## 2016-11-14 MED ORDER — ROCURONIUM BROMIDE 50 MG/5ML IV SOSY
PREFILLED_SYRINGE | INTRAVENOUS | Status: AC
  Filled 2016-11-14: qty 5

## 2016-11-14 MED ORDER — MEPERIDINE HCL 25 MG/ML IJ SOLN: 6.2500 mg | INTRAMUSCULAR | Status: DC | PRN

## 2016-11-14 MED ORDER — SUCCINYLCHOLINE CHLORIDE 200 MG/10ML IV SOSY
PREFILLED_SYRINGE | INTRAVENOUS | Status: AC
  Filled 2016-11-14: qty 10

## 2016-11-14 MED ORDER — BUPIVACAINE HCL 0.25 % IJ SOLN
INTRAMUSCULAR | Status: DC | PRN
  Administered 2016-11-14: 12 mL

## 2016-11-14 MED ORDER — 0.9 % SODIUM CHLORIDE (POUR BTL) OPTIME
TOPICAL | Status: DC | PRN
Start: 2016-11-14 — End: 2016-11-14
  Administered 2016-11-14: 1000 mL

## 2016-11-14 MED ORDER — SODIUM CHLORIDE 0.9 % IV SOLN
INTRAVENOUS | Status: DC
  Administered 2016-11-14: 21:00:00 via INTRAVENOUS

## 2016-11-14 MED ORDER — DEXTROSE 5 % IV SOLN: 2.0000 g | INTRAVENOUS | Status: DC

## 2016-11-14 MED ORDER — 0.9 % SODIUM CHLORIDE (POUR BTL) OPTIME
TOPICAL | Status: DC | PRN
  Administered 2016-11-14: 1000 mL

## 2016-11-14 MED ORDER — DIPHENHYDRAMINE HCL 12.5 MG/5ML PO ELIX: 12.5000 mg | ORAL_SOLUTION | Freq: Four times a day (QID) | ORAL | Status: DC | PRN

## 2016-11-14 MED ORDER — PROPOFOL 10 MG/ML IV BOLUS
INTRAVENOUS | Status: AC
  Filled 2016-11-14: qty 40

## 2016-11-14 MED ORDER — DEXTROSE 5 % IV SOLN
2.0000 g | Freq: Once | INTRAVENOUS | Status: AC
  Administered 2016-11-14: 2 g via INTRAVENOUS
  Filled 2016-11-14: qty 2

## 2016-11-14 MED ORDER — ONDANSETRON HCL 4 MG/2ML IJ SOLN
INTRAMUSCULAR | Status: AC
  Filled 2016-11-14: qty 2

## 2016-11-14 MED ORDER — ONDANSETRON HCL 4 MG/2ML IJ SOLN
INTRAMUSCULAR | Status: DC | PRN
Start: 2016-11-14 — End: 2016-11-14
  Administered 2016-11-14: 4 mg via INTRAVENOUS

## 2016-11-14 MED ORDER — MIDAZOLAM HCL 2 MG/2ML IJ SOLN
INTRAMUSCULAR | Status: AC
  Filled 2016-11-14: qty 2

## 2016-11-14 MED ORDER — ACETAMINOPHEN 325 MG PO TABS: 650.0000 mg | ORAL_TABLET | Freq: Four times a day (QID) | ORAL | Status: DC | PRN

## 2016-11-14 MED ORDER — DOCUSATE SODIUM 100 MG PO CAPS
100.0000 mg | ORAL_CAPSULE | Freq: Two times a day (BID) | ORAL | Status: DC
  Administered 2016-11-14 – 2016-11-15 (×2): 100 mg via ORAL
  Filled 2016-11-14 (×2): qty 1

## 2016-11-14 MED ORDER — ONDANSETRON HCL 4 MG/2ML IJ SOLN
4.0000 mg | Freq: Once | INTRAMUSCULAR | Status: AC
  Administered 2016-11-14: 4 mg via INTRAVENOUS
  Filled 2016-11-14: qty 2

## 2016-11-14 MED ORDER — ONDANSETRON 4 MG PO TBDP
ORAL_TABLET | ORAL | Status: AC
  Filled 2016-11-14: qty 1

## 2016-11-14 MED ORDER — MIDAZOLAM HCL 5 MG/5ML IJ SOLN
INTRAMUSCULAR | Status: DC | PRN
  Administered 2016-11-14: 2 mg via INTRAVENOUS

## 2016-11-14 MED ORDER — BUPIVACAINE HCL (PF) 0.25 % IJ SOLN
INTRAMUSCULAR | Status: AC
  Filled 2016-11-14: qty 30

## 2016-11-14 MED ORDER — PIPERACILLIN-TAZOBACTAM 3.375 G IVPB
3.3750 g | Freq: Once | INTRAVENOUS | Status: AC
  Administered 2016-11-14: 3.375 g via INTRAVENOUS
  Filled 2016-11-14: qty 50

## 2016-11-14 MED ORDER — PROPOFOL 10 MG/ML IV BOLUS
INTRAVENOUS | Status: DC | PRN
  Administered 2016-11-14: 200 mg via INTRAVENOUS

## 2016-11-14 MED ORDER — FENTANYL CITRATE (PF) 250 MCG/5ML IJ SOLN
INTRAMUSCULAR | Status: DC | PRN
  Administered 2016-11-14 (×2): 50 ug via INTRAVENOUS
  Administered 2016-11-14: 100 ug via INTRAVENOUS

## 2016-11-14 MED ORDER — ONDANSETRON 4 MG PO TBDP
4.0000 mg | ORAL_TABLET | Freq: Once | ORAL | Status: AC
  Administered 2016-11-14: 4 mg via ORAL

## 2016-11-14 MED ORDER — HYDROMORPHONE HCL 1 MG/ML IJ SOLN: 0.2500 mg | INTRAMUSCULAR | Status: DC | PRN

## 2016-11-14 MED ORDER — MORPHINE SULFATE (PF) 4 MG/ML IV SOLN
4.0000 mg | Freq: Once | INTRAVENOUS | Status: AC
  Administered 2016-11-14: 4 mg via INTRAVENOUS
  Filled 2016-11-14: qty 1

## 2016-11-14 SURGICAL SUPPLY — 44 items
APPLIER CLIP 5 13 M/L LIGAMAX5 (MISCELLANEOUS)
BENZOIN TINCTURE PRP APPL 2/3 (GAUZE/BANDAGES/DRESSINGS) IMPLANT
BLADE SURG ROTATE 9660 (MISCELLANEOUS) ×3 IMPLANT
CANISTER SUCTION 2500CC (MISCELLANEOUS) ×3 IMPLANT
CHLORAPREP W/TINT 26ML (MISCELLANEOUS) ×3 IMPLANT
CLIP APPLIE 5 13 M/L LIGAMAX5 (MISCELLANEOUS) IMPLANT
COVER SURGICAL LIGHT HANDLE (MISCELLANEOUS) ×3 IMPLANT
COVER TRANSDUCER ULTRASND (DRAPES) IMPLANT
CUTTER FLEX LINEAR 45M (STAPLE) ×3 IMPLANT
DERMABOND ADVANCED (GAUZE/BANDAGES/DRESSINGS) ×2
DERMABOND ADVANCED .7 DNX12 (GAUZE/BANDAGES/DRESSINGS) ×1 IMPLANT
DEVICE TROCAR PUNCTURE CLOSURE (ENDOMECHANICALS) IMPLANT
ELECT REM PT RETURN 9FT ADLT (ELECTROSURGICAL) ×3
ELECTRODE REM PT RTRN 9FT ADLT (ELECTROSURGICAL) ×1 IMPLANT
ENDOLOOP SUT PDS II  0 18 (SUTURE)
ENDOLOOP SUT PDS II 0 18 (SUTURE) IMPLANT
GLOVE BIO SURGEON STRL SZ7.5 (GLOVE) IMPLANT
GOWN STRL REUS W/ TWL LRG LVL3 (GOWN DISPOSABLE) ×1 IMPLANT
GOWN STRL REUS W/ TWL XL LVL3 (GOWN DISPOSABLE) ×1 IMPLANT
GOWN STRL REUS W/TWL LRG LVL3 (GOWN DISPOSABLE) ×2
GOWN STRL REUS W/TWL XL LVL3 (GOWN DISPOSABLE) ×2
KIT BASIN OR (CUSTOM PROCEDURE TRAY) ×3 IMPLANT
KIT ROOM TURNOVER OR (KITS) ×3 IMPLANT
NEEDLE INSUFFLATION 14GA 120MM (NEEDLE) IMPLANT
NS IRRIG 1000ML POUR BTL (IV SOLUTION) ×3 IMPLANT
PAD ARMBOARD 7.5X6 YLW CONV (MISCELLANEOUS) ×6 IMPLANT
POUCH SPECIMEN RETRIEVAL 10MM (ENDOMECHANICALS) ×3 IMPLANT
RELOAD 45 VASCULAR/THIN (ENDOMECHANICALS) ×3 IMPLANT
RELOAD STAPLE TA45 3.5 REG BLU (ENDOMECHANICALS) ×3 IMPLANT
SCISSORS LAP 5X35 DISP (ENDOMECHANICALS) ×3 IMPLANT
SET IRRIG TUBING LAPAROSCOPIC (IRRIGATION / IRRIGATOR) ×3 IMPLANT
SLEEVE ENDOPATH XCEL 5M (ENDOMECHANICALS) ×3 IMPLANT
SPECIMEN JAR SMALL (MISCELLANEOUS) ×3 IMPLANT
SUT MNCRL AB 3-0 PS2 18 (SUTURE) IMPLANT
SUT MNCRL AB 4-0 PS2 18 (SUTURE) ×3 IMPLANT
SUT SILK 2 0 SH (SUTURE) IMPLANT
TOWEL OR 17X24 6PK STRL BLUE (TOWEL DISPOSABLE) ×3 IMPLANT
TOWEL OR 17X26 10 PK STRL BLUE (TOWEL DISPOSABLE) ×3 IMPLANT
TRAY FOLEY CATH 16FR SILVER (SET/KITS/TRAYS/PACK) IMPLANT
TRAY LAPAROSCOPIC MC (CUSTOM PROCEDURE TRAY) ×3 IMPLANT
TROCAR XCEL 12X100 BLDLESS (ENDOMECHANICALS) ×3 IMPLANT
TROCAR XCEL NON-BLD 11X100MML (ENDOMECHANICALS) ×3 IMPLANT
TROCAR XCEL NON-BLD 5MMX100MML (ENDOMECHANICALS) ×3 IMPLANT
TUBING INSUFFLATION (TUBING) ×3 IMPLANT

## 2016-11-14 NOTE — Anesthesia Postprocedure Evaluation (Signed)
Anesthesia Post Note  Patient: Colton DowseShawn W Vandeventer  Procedure(s) Performed: Procedure(s) (LRB): APPENDECTOMY LAPAROSCOPIC (N/A)  Patient location during evaluation: PACU Anesthesia Type: General Level of consciousness: sedated and patient cooperative Pain management: pain level controlled Vital Signs Assessment: post-procedure vital signs reviewed and stable Respiratory status: spontaneous breathing Cardiovascular status: stable Anesthetic complications: no       Last Vitals:  Vitals:   11/14/16 1930 11/14/16 1945  BP: (!) 142/86 120/85  Pulse:    Resp:    Temp:  36.9 C    Last Pain:  Vitals:   11/14/16 1945  PainSc: 0-No pain                 Lewie LoronJohn Roxana Lai

## 2016-11-14 NOTE — Progress Notes (Signed)
Patient arrived to floor. [place] from [place], [place]. Patient alert and oriented. Incision site clean, dry, and intact. Patient due to void.

## 2016-11-14 NOTE — Anesthesia Procedure Notes (Signed)
Procedure Name: Intubation Date/Time: 11/14/2016 5:56 PM Performed by: Brien MatesMAHONY, Darrin Apodaca D Pre-anesthesia Checklist: Patient identified, Emergency Drugs available, Suction available, Patient being monitored and Timeout performed Patient Re-evaluated:Patient Re-evaluated prior to inductionOxygen Delivery Method: Circle system utilized Preoxygenation: Pre-oxygenation with 100% oxygen Intubation Type: IV induction, Rapid sequence and Cricoid Pressure applied Laryngoscope Size: Miller and 2 Grade View: Grade I Tube type: Oral Tube size: 7.5 mm Number of attempts: 1 Airway Equipment and Method: Stylet Placement Confirmation: ETT inserted through vocal cords under direct vision,  positive ETCO2 and breath sounds checked- equal and bilateral Secured at: 22 cm Tube secured with: Tape Dental Injury: Teeth and Oropharynx as per pre-operative assessment

## 2016-11-14 NOTE — Anesthesia Preprocedure Evaluation (Signed)
Anesthesia Evaluation  Patient identified by MRN, date of birth, ID band Patient awake    Reviewed: Allergy & Precautions, NPO status , Patient's Chart, lab work & pertinent test results  Airway Mallampati: II  TM Distance: >3 FB Neck ROM: Full    Dental no notable dental hx.    Pulmonary neg pulmonary ROS, Current Smoker,    Pulmonary exam normal breath sounds clear to auscultation       Cardiovascular negative cardio ROS Normal cardiovascular exam Rhythm:Regular Rate:Normal     Neuro/Psych negative neurological ROS  negative psych ROS   GI/Hepatic negative GI ROS, Neg liver ROS,   Endo/Other  negative endocrine ROS  Renal/GU negative Renal ROS  negative genitourinary   Musculoskeletal negative musculoskeletal ROS (+)   Abdominal   Peds negative pediatric ROS (+)  Hematology negative hematology ROS (+)   Anesthesia Other Findings   Reproductive/Obstetrics negative OB ROS                             Anesthesia Physical Anesthesia Plan  ASA: II and emergent  Anesthesia Plan: General   Post-op Pain Management:    Induction: Intravenous, Rapid sequence and Cricoid pressure planned  Airway Management Planned: Oral ETT  Additional Equipment:   Intra-op Plan:   Post-operative Plan: Extubation in OR  Informed Consent: I have reviewed the patients History and Physical, chart, labs and discussed the procedure including the risks, benefits and alternatives for the proposed anesthesia with the patient or authorized representative who has indicated his/her understanding and acceptance.   Dental advisory given  Plan Discussed with: CRNA  Anesthesia Plan Comments:         Anesthesia Quick Evaluation  

## 2016-11-14 NOTE — Op Note (Signed)
Operative Report  Colton DowseShawn W Hicks 25 y.o. male  [id number]  [id number]  11/14/2016  Surgeon: Berna Buehelsea A Lucillia Corson   Assistant: none  Procedure performed: Laparoscopic Appendectomy  Preop diagnosis: Acute appendicitis  Post-op diagnosis/intraop findings: Acute appendicitis without perforation  Specimens: appendix  EBL: minimal  Complications: none  Description of procedure: After obtaining informed consent the patient was brought to the operating room. Antibiotics and subcutaneous heparin were administered. SCD's were applied. General endotracheal anesthesia was initiated and a formal time-out was performed. The abdomen was prepped and draped in the usual sterile fashion and the abdomen was entered using an infraumbilical Veress needle and insufflated to 15 mmHg. A 5 mm trocar and camera were then introduced, the abdomen was inspected and there is no evidence of injury from our entry. A suprapubic 5 mm trocar and a left lower quadrant 12 mm trocar were introduced under direct visualization following infiltration with local. The patient was then placed in Trendelenburg and rotated to the left and the small bowel was reflected cephalad. The appendix was visualized: it was mildly injected and dilated, no free fluid or purulence was present. The appendix was retrocecal. A combination of blunt dissection and hook electrocautery were used to free it of its retroperitoneal attachments. The ligament of Aileen Pilotreves was adherent to the midappendix and this was divided with cautery.Great care was taken to ensure no injury to surrounding retroperitoneal structures, cecum or terminal ileum.  A window was created at the base of the appendix and a blue load linear cutting stapler was used to transect the appendix from the cecum. A white load linear cutting stapler was then used to transect the appendiceal mesentery. Some bleeding occurred here following division with the stapler; additional cautery was used to  obtain hemostasis of the mesenteric staple line. The appendix was placed in an Endo Catch bag and removed through our 12 mm trocar site. The abdomen was once again inspected and no abnormalities or injuries noted; and the omentum was directed into the right lower quadrant. The abdomen was desufflated and all trocars removed.The 12mm trocar site in the left lower quadrant was closed with a 0 vicryl in the fascia.  The skin incisions were closed with running subcuticular monocryl and Dermabond. The patient was awakened, extubated and transported to the recovery room in stable condition.   All counts were correct at the completion of the case.

## 2016-11-14 NOTE — ED Notes (Signed)
Pt transported to CT at this time.

## 2016-11-14 NOTE — ED Triage Notes (Signed)
Pt. Stated, I woke up this morning with N/V and a real bad stomach ache.

## 2016-11-14 NOTE — ED Provider Notes (Signed)
[facility]-EMERGENCY DEPT Provider Note   CSN: [id number] Arrival date & time: 11/14/16  1240     History   Chief Complaint Chief Complaint  Patient presents with  . Emesis  . Nausea  . Abdominal Pain    HPI Colton Hicks is a 26 y.o. male.  HPI   Patient is a 26 year old male with no pertinent past medical history presents the ED with complaint of abdominal pain, onset 6 AM. Patient reports waking up this morning due to severe sharp tight pain to the middle of his belly. Patient reports the pain has been waxing and waning in nature since onset. Reports associated nausea and vomiting and CT has been unable to tolerate any fluids at home. Denies any known sick contacts. Endorses associated chills. Denies fever, headache, nasal congestion, rhinorrhea, sore throat, cough, shortness of breath, chest pain, hematemesis, diarrhea, constipation, urinary symptoms, blood in urine or stool, penile/testicular pain or swelling. Denies history of abdominal surgeries. Denies taking any medications prior to arrival.  History reviewed. No pertinent past medical history.  Patient Active Problem List   Diagnosis Date Noted  . Acute appendicitis 11/14/2016    History reviewed. No pertinent surgical history.     Home Medications    Prior to Admission medications   Medication Sig Start Date End Date Taking? Authorizing Provider  doxycycline (VIBRAMYCIN) 100 MG capsule Take 1 capsule (100 mg total) by mouth 2 (two) times daily. Patient not taking: Reported on 11/14/2016 10/19/16   Elson AreasLeslie K Sofia, PA-C  HYDROcodone-acetaminophen (NORCO/VICODIN) 5-325 MG tablet Take 2 tablets by mouth every 4 (four) hours as needed. Patient not taking: Reported on 11/14/2016 10/19/16   Elson AreasLeslie K Sofia, PA-C    Family History No family history on file.  Social History Social History  Substance Use Topics  . Smoking status: Current Every Day Smoker  . Smokeless tobacco: Never Used  . Alcohol use No     Allergies     Patient has no known allergies.   Review of Systems Review of Systems  Constitutional: Positive for chills.  Gastrointestinal: Positive for abdominal pain, nausea and vomiting.  All other systems reviewed and are negative.    Physical Exam Updated Vital Signs BP 130/85   Pulse 70   Temp 97.9 F (36.6 C)   Resp 17   Ht 5\' 9"  (1.753 m)   Wt 63.5 kg   SpO2 99%   BMI 20.67 kg/m   Physical Exam  Constitutional: He is oriented to person, place, and time. He appears well-developed and well-nourished.  Pt appears to be in mild discomfort.  HENT:  Head: Normocephalic and atraumatic.  Mouth/Throat: Uvula is midline and oropharynx is clear and moist. Mucous membranes are dry. No oropharyngeal exudate, posterior oropharyngeal edema, posterior oropharyngeal erythema or tonsillar abscesses. No tonsillar exudate.  Eyes: Conjunctivae and EOM are normal. Right eye exhibits no discharge. Left eye exhibits no discharge. No scleral icterus.  Neck: Normal range of motion. Neck supple.  Cardiovascular: Normal rate, regular rhythm, normal heart sounds and intact distal pulses.   Pulmonary/Chest: Effort normal and breath sounds normal. No respiratory distress. He has no wheezes. He has no rales. He exhibits no tenderness.  Abdominal: Soft. Bowel sounds are normal. He exhibits no distension and no mass. There is tenderness in the right lower quadrant and periumbilical area. There is no rigidity, no rebound, no guarding, no CVA tenderness and negative Murphy's sign. No hernia.  + Rovsing sign  Musculoskeletal: Normal range of motion. He  exhibits no edema.  Neurological: He is alert and oriented to person, place, and time.  Skin: Skin is warm and dry.  Nursing note and vitals reviewed.    ED Treatments / Results  Labs (all labs ordered are listed, but only abnormal results are displayed) Labs Reviewed  COMPREHENSIVE METABOLIC PANEL - Abnormal; Notable for the following:       Result Value    Glucose, Bld 123 (*)    All other components within normal limits  CBC - Abnormal; Notable for the following:    RBC 5.86 (*)    All other components within normal limits  URINALYSIS, ROUTINE W REFLEX MICROSCOPIC - Abnormal; Notable for the following:    Ketones, ur 5 (*)    All other components within normal limits  LIPASE, BLOOD    EKG  EKG Interpretation None       Radiology Ct Abdomen Pelvis W Contrast  Result Date: 11/14/2016 CLINICAL DATA:  Acute right lower quadrant abdominal pain, vomiting. EXAM: CT ABDOMEN AND PELVIS WITH CONTRAST TECHNIQUE: Multidetector CT imaging of the abdomen and pelvis was performed using the standard protocol following bolus administration of intravenous contrast. CONTRAST:  ISOVUE-300 IOPAMIDOL (ISOVUE-300) INJECTION 61% COMPARISON:  None. FINDINGS: Lower chest: No acute abnormality. Hepatobiliary: No focal liver abnormality is seen. No gallstones, gallbladder wall thickening, or biliary dilatation. Pancreas: Unremarkable. No pancreatic ductal dilatation or surrounding inflammatory changes. Spleen: Normal in size without focal abnormality. Adrenals/Urinary Tract: Adrenal glands are unremarkable. Kidneys are normal, without renal calculi, focal lesion, or hydronephrosis. Bladder is unremarkable. Stomach/Bowel: There is no evidence of bowel obstruction. The appendix is enlarged and fluid filled, measuring 11 mm in diameter. Appendicolith is noted. Although does not appear to be a significant amount of surrounding inflammation, this is concerning for acute appendicitis given the patient's clinical history. Vascular/Lymphatic: No significant vascular findings are present. No enlarged abdominal or pelvic lymph nodes. Reproductive: Prostate is unremarkable. Other: No abdominal wall hernia or abnormality. No abdominopelvic ascites. Musculoskeletal: No acute or significant osseous findings. IMPRESSION: The appendix is significantly enlarged and fluid-filled, with  small appendicolith present. Although there does not appear to be a significant amount of surrounding inflammation, this is concerning for acute appendicitis given the patient's clinical history. Electronically Signed   By: Lupita Raider, M.D.   On: 11/14/2016 16:02    Procedures Procedures (including critical care time)  Medications Ordered in ED Medications  cefTRIAXone (ROCEPHIN) 2 g in dextrose 5 % 50 mL IVPB (2 g Intravenous New Bag/Given 11/14/16 1647)    And  piperacillin-tazobactam (ZOSYN) IVPB 3.375 g (not administered)  enoxaparin (LOVENOX) injection 40 mg (not administered)  0.9 %  sodium chloride infusion (not administered)  cefTRIAXone (ROCEPHIN) 2 g in dextrose 5 % 50 mL IVPB (not administered)    And  metroNIDAZOLE (FLAGYL) IVPB 500 mg (not administered)  acetaminophen (TYLENOL) tablet 650 mg (not administered)    Or  acetaminophen (TYLENOL) suppository 650 mg (not administered)  HYDROmorphone (DILAUDID) injection 0.5 mg (not administered)  oxyCODONE (Oxy IR/ROXICODONE) immediate release tablet 5 mg (not administered)  diphenhydrAMINE (BENADRYL) 12.5 MG/5ML elixir 12.5 mg (not administered)    Or  diphenhydrAMINE (BENADRYL) injection 12.5 mg (not administered)  docusate sodium (COLACE) capsule 100 mg (not administered)  ondansetron (ZOFRAN-ODT) disintegrating tablet 4 mg (4 mg Oral Given 11/14/16 1328)  sodium chloride 0.9 % bolus 1,000 mL (0 mLs Intravenous Stopped 11/14/16 1631)  ondansetron (ZOFRAN) injection 4 mg (4 mg Intravenous Given 11/14/16 1502)  morphine 4 MG/ML injection 4 mg (4 mg Intravenous Given 11/14/16 1503)  iopamidol (ISOVUE-300) 61 % injection (100 mLs  Contrast Given 11/14/16 1513)     Initial Impression / Assessment and Plan / ED Course  I have reviewed the triage vital signs and the nursing notes.  Pertinent labs & imaging results that were available during my care of the patient were reviewed by me and considered in my medical decision making (see  chart for details).    Pt presents with sharp mid abdominal pain that started this morning with associated N/V. Unable to tolerate fluids at home. Denies fever or any known sick contacts. VSS. Exam revealed severe TTP over RLQ and mild TTP over periumbilical region with + Rovsing. Dry mucous membranes. Remaining exam unremarkable. Pt given IVF, pain meds and zofran.  UA showed small amount of ketones, suspected from dehydration. Remaining labs unremarkable. Due to pt with severe abdominal tenderness on exam, will order CT abdomen due to concern for appendicitis.   CT abdomen showed acute appendicitis. Pt started on IV antibiotics and remained NPO. Consulted surgery. Dr. Fredricka Bonine evaluated pt in the ED. Plan to admit pt for surgery. Discussed results and plan for admission with pt.   Final Clinical Impressions(s) / ED Diagnoses   Final diagnoses:  Acute appendicitis, unspecified acute appendicitis type    New Prescriptions New Prescriptions   No medications on file     Barrett Henle, PA-C 11/14/16 1707    Cathren Laine, MD 11/15/16 [phone]

## 2016-11-14 NOTE — H&P (Signed)
Surgical H&P  CC: abdominal pain  HPI: otherwise healthy 25yo who awoke with generalized abdominal pain this morning. The pain gradually localized to the right lower quadrant. Anorexia. Nausea and emesis. CT consistent with acute appendicitis.   He works at [employer]. Smokes about 1ppd.  No Known Allergies  History reviewed. No pertinent past medical history.  History reviewed. No pertinent surgical history.  No family history on file.  Social History   Social History  . Marital status: Single    Spouse name: N/A  . Number of children: N/A  . Years of education: N/A   Social History Main Topics  . Smoking status: Current Every Day Smoker  . Smokeless tobacco: Never Used  . Alcohol use No  . Drug use: No  . Sexual activity: Not Asked   Other Topics Concern  . None   Social History Narrative  . None    No current facility-administered medications on file prior to encounter.    Current Outpatient Prescriptions on File Prior to Encounter  Medication Sig Dispense Refill  . doxycycline (VIBRAMYCIN) 100 MG capsule Take 1 capsule (100 mg total) by mouth 2 (two) times daily. (Patient not taking: Reported on 11/14/2016) 20 capsule 0  . HYDROcodone-acetaminophen (NORCO/VICODIN) 5-325 MG tablet Take 2 tablets by mouth every 4 (four) hours as needed. (Patient not taking: Reported on 11/14/2016) 16 tablet 0    Review of Systems: a complete, 10pt review of systems was completed with pertinent positives and negatives as documented in the HPI.   Physical Exam: Vitals:   11/14/16 1329 11/14/16 1608  BP:  130/85  Pulse:  70  Resp:  17  Temp: 97.9 F (36.6 C)    Gen: A&Ox3, no distress  Head: normocephalic, atraumatic, EOMI, anicteric.  Neck: supple without mass or thyromegaly Chest: unlabored respirations symmetrical air entry  Cardiovascular: RRR with palpable distal pulses Abdomen: soft, nondistended, TTP RLQ and periumbilical. Tiny umbilical hernia. No peritonitis.   Extremities: warm, without edema, no deformities  Neuro: grossly intact Psych: appropriate mood and affect  Skin: no lesions or rashes on limited skin exam.   CBC Latest Ref Rng & Units 11/14/2016 10/19/2015  WBC 4.0 - 10.5 K/uL 9.2 14.3(H)  Hemoglobin 13.0 - 17.0 g/dL 16.116.2 17.7(H)  Hematocrit 39.0 - 52.0 % 47.0 49.6  Platelets 150 - 400 K/uL 207 199    CMP Latest Ref Rng & Units 11/14/2016 10/19/2015  Glucose 65 - 99 mg/dL 096(E123(H) 454(U100(H)  BUN 6 - 20 mg/dL 6 13  Creatinine 9.810.61 - 1.24 mg/dL 1.911.09 4.781.13  Sodium 295135 - 145 mmol/L 137 141  Potassium 3.5 - 5.1 mmol/L 3.8 3.7  Chloride 101 - 111 mmol/L 104 103  CO2 22 - 32 mmol/L 22 24  Calcium 8.9 - 10.3 mg/dL 9.6 9.6  Total Protein 6.5 - 8.1 g/dL 7.3 7.1  Total Bilirubin 0.3 - 1.2 mg/dL 0.7 6.2(Z1.7(H)  Alkaline Phos 38 - 126 U/L 50 48  AST 15 - 41 U/L 26 30  ALT 17 - 63 U/L 30 32    No results found for: INR, PROTIME  Imaging: Ct reviewed- dilated appendix with appendicolith  A/P: 25yo with acute appendicitis. Discussed laparoscopic appendectomy with him. Discussed risks of bleeding, pain, scarring, injury to adjacent structures, abscess, ileus, etc. He expressed understanding and is agreeable to proceed. OR this afternoon.    Colton Blakeshelsea Colton Holquin, MD [facility], [place] Pager [phone]

## 2016-11-14 NOTE — Transfer of Care (Signed)
Immediate Anesthesia Transfer of Care Note  Patient: Colton DowseShawn W Fairfax  Procedure(s) Performed: Procedure(s): APPENDECTOMY LAPAROSCOPIC (N/A)  Patient Location: PACU  Anesthesia Type:General  Level of Consciousness: sedated  Airway & Oxygen Therapy: Patient Spontanous Breathing  Post-op Assessment: Report given to RN and Post -op Vital signs reviewed and stable  Post vital signs: Reviewed and stable  Last Vitals:  Vitals:   11/14/16 1608 11/14/16 1700  BP: 130/85 117/72  Pulse: 70 63  Resp: 17 18  Temp:      Last Pain:  Vitals:   11/14/16 1650  PainSc: 0-No pain         Complications: No apparent anesthesia complications

## 2016-11-14 NOTE — Anesthesia Preprocedure Evaluation (Addendum)
Anesthesia Evaluation  Patient identified by MRN, date of birth, ID band Patient awake    Reviewed: Allergy & Precautions, NPO status , Patient's Chart, lab work & pertinent test results  Airway Mallampati: II  TM Distance: <3 FB Neck ROM: Full    Dental  (+) Teeth Intact, Dental Advisory Given   Pulmonary neg pulmonary ROS, Current Smoker,    breath sounds clear to auscultation       Cardiovascular negative cardio ROS   Rhythm:Regular Rate:Normal     Neuro/Psych negative neurological ROS  negative psych ROS   GI/Hepatic negative GI ROS, Neg liver ROS,   Endo/Other  negative endocrine ROS  Renal/GU negative Renal ROS     Musculoskeletal negative musculoskeletal ROS (+)   Abdominal   Peds  Hematology negative hematology ROS (+)   Anesthesia Other Findings   Reproductive/Obstetrics                            Anesthesia Physical Anesthesia Plan  ASA: II and emergent  Anesthesia Plan: General   Post-op Pain Management:    Induction: Intravenous, Rapid sequence and Cricoid pressure planned  Airway Management Planned: Oral ETT  Additional Equipment:   Intra-op Plan:   Post-operative Plan: Extubation in OR  Informed Consent: I have reviewed the patients History and Physical, chart, labs and discussed the procedure including the risks, benefits and alternatives for the proposed anesthesia with the patient or authorized representative who has indicated his/her understanding and acceptance.   Dental advisory given  Plan Discussed with: Anesthesiologist and Surgeon  Anesthesia Plan Comments:         Anesthesia Quick Evaluation

## 2016-11-15 ENCOUNTER — Encounter (HOSPITAL_COMMUNITY): Payer: Self-pay | Admitting: Surgery

## 2016-11-15 DIAGNOSIS — K381 Appendicular concretions: Secondary | ICD-10-CM | POA: Diagnosis not present

## 2016-11-15 DIAGNOSIS — K3580 Unspecified acute appendicitis: Secondary | ICD-10-CM | POA: Diagnosis not present

## 2016-11-15 DIAGNOSIS — F17210 Nicotine dependence, cigarettes, uncomplicated: Secondary | ICD-10-CM | POA: Diagnosis not present

## 2016-11-15 MED ORDER — OXYCODONE HCL 5 MG PO TABS: 5.0000 mg | ORAL_TABLET | ORAL | 0 refills | Status: DC | PRN

## 2016-11-15 NOTE — Progress Notes (Signed)
Patient needs encouragement with ambulation. 

## 2016-11-15 NOTE — Progress Notes (Signed)
1 Day Post-Op lap appy Subjective: Emesis this AM  Objective: Vital signs in last 24 hours: Temp:  [97.9 F (36.6 C)-98.9 F (37.2 C)] 98.9 F (37.2 C) (02/04 0546) Pulse Rate:  [60-96] 65 (02/04 0546) Resp:  [14-18] 18 (02/04 0546) BP: (109-149)/(63-95) 122/72 (02/04 0546) SpO2:  [96 %-100 %] 99 % (02/04 0546) Weight:  [63.5 kg (140 lb)-65.6 kg (144 lb 10 oz)] 65.6 kg (144 lb 10 oz) (02/03 2021)   Intake/Output from previous day: 02/03 0701 - 02/04 0700 In: 3583.3 [P.O.:360; I.V.:1973.3; IV Piggyback:1250] Out: 375 [Urine:350; Blood:25] Intake/Output this shift: No intake/output data recorded.   General appearance: alert and cooperative GI: soft, non-distended  Incision: no significant drainage  Lab Results:   Recent Labs  11/14/16 1332  WBC 9.2  HGB 16.2  HCT 47.0  PLT 207   BMET  Recent Labs  11/14/16 1332  NA 137  K 3.8  CL 104  CO2 22  GLUCOSE 123*  BUN 6  CREATININE 1.09  CALCIUM 9.6   PT/INR No results for input(s): LABPROT, INR in the last 72 hours. ABG No results for input(s): PHART, HCO3 in the last 72 hours.  Invalid input(s): PCO2, PO2  MEDS, Scheduled . cefTRIAXone (ROCEPHIN)  IV  2 g Intravenous Q24H   And  . metronidazole  500 mg Intravenous Q8H  . docusate sodium  100 mg Oral BID  . enoxaparin (LOVENOX) injection  40 mg Subcutaneous Q24H    Studies/Results: Ct Abdomen Pelvis W Contrast  Result Date: 11/14/2016 CLINICAL DATA:  Acute right lower quadrant abdominal pain, vomiting. EXAM: CT ABDOMEN AND PELVIS WITH CONTRAST TECHNIQUE: Multidetector CT imaging of the abdomen and pelvis was performed using the standard protocol following bolus administration of intravenous contrast. CONTRAST:  100mL ISOVUE-300 IOPAMIDOL (ISOVUE-300) INJECTION 61% COMPARISON:  None. FINDINGS: Lower chest: No acute abnormality. Hepatobiliary: No focal liver abnormality is seen. No gallstones, gallbladder wall thickening, or biliary dilatation. Pancreas:  Unremarkable. No pancreatic ductal dilatation or surrounding inflammatory changes. Spleen: Normal in size without focal abnormality. Adrenals/Urinary Tract: Adrenal glands are unremarkable. Kidneys are normal, without renal calculi, focal lesion, or hydronephrosis. Bladder is unremarkable. Stomach/Bowel: There is no evidence of bowel obstruction. The appendix is enlarged and fluid filled, measuring 11 mm in diameter. Appendicolith is noted. Although does not appear to be a significant amount of surrounding inflammation, this is concerning for acute appendicitis given the patient's clinical history. Vascular/Lymphatic: No significant vascular findings are present. No enlarged abdominal or pelvic lymph nodes. Reproductive: Prostate is unremarkable. Other: No abdominal wall hernia or abnormality. No abdominopelvic ascites. Musculoskeletal: No acute or significant osseous findings. IMPRESSION: The appendix is significantly enlarged and fluid-filled, with small appendicolith present. Although there does not appear to be a significant amount of surrounding inflammation, this is concerning for acute appendicitis given the patient's clinical history. Electronically Signed   By: Lupita RaiderJames  Green Jr, M.D.   On: 11/14/2016 16:02    Assessment: s/p Procedure(s): APPENDECTOMY LAPAROSCOPIC Patient Active Problem List   Diagnosis Date Noted  . Acute appendicitis 11/14/2016    Post op N/V  Plan: Advance diet as tolerated.  Nausea most likely due to anesthesia effects If doing better later today, ok to d/c.  Otherwise will keep again overnight.   LOS: 0 days     .Vanita PandaAlicia C Belia Febo, MD [facility], [place] [phone]   11/15/2016 8:15 AM

## 2016-11-15 NOTE — Discharge Instructions (Signed)
LAPAROSCOPIC SURGERY: POST OP INSTRUCTIONS ° °1. DIET: Follow a light bland diet the first 24 hours after arrival home, such as soup, liquids, crackers, etc.  Be sure to include lots of fluids daily.  Avoid fast food or heavy meals as your are more likely to get nauseated.  Eat a low fat the next few days after surgery.   °2. Take your usually prescribed home medications unless otherwise directed. °3. PAIN CONTROL: °a. Pain is best controlled by a usual combination of three different methods TOGETHER: °i. Ice/Heat °ii. Over the counter pain medication °iii. Prescription pain medication °b. Most patients will experience some swelling and bruising around the incisions.  Ice packs or heating pads (30-60 minutes up to 6 times a day) will help. Use ice for the first few days to help decrease swelling and bruising, then switch to heat to help relax tight/sore spots and speed recovery.  Some people prefer to use ice alone, heat alone, alternating between ice & heat.  Experiment to what works for you.  Swelling and bruising can take several weeks to resolve.   °c. It is helpful to take an over-the-counter pain medication regularly for the first few weeks.  Choose one of the following that works best for you: °i. Naproxen (Aleve, etc)  Two 220mg tabs twice a day °ii. Ibuprofen (Advil, etc) Three 200mg tabs four times a day (every meal & bedtime) °d. A  prescription for pain medication (such as percocet, vicodin, oxycodone, hydrocodone, etc) should be given to you upon discharge.  Take your pain medication as prescribed.  °i. If you are having problems/concerns with the prescription medicine (does not control pain, nausea, vomiting, rash, itching, etc), please call us [phone] to see if we need to switch you to a different pain medicine that will work better for you and/or control your side effect better. °ii. If you need a refill on your pain medication, please contact your pharmacy.  They will contact our office to  request authorization. Prescriptions will not be filled after 5 pm or on week-ends. ° ° °4. Avoid getting constipated.  Between the surgery and the pain medications, it is common to experience some constipation.  Increasing fluid intake and taking a fiber supplement (such as Metamucil, Citrucel, FiberCon, MiraLax, etc) 1-2 times a day regularly will usually help prevent this problem from occurring.  A mild laxative (prune juice, Milk of Magnesia, MiraLax, etc) should be taken according to package directions if there are no bowel movements after 48 hours.   °5. Watch out for diarrhea.  If you have many loose bowel movements, simplify your diet to bland foods & liquids for a few days.  Stop any stool softeners and decrease your fiber supplement.  Switching to mild anti-diarrheal medications (Kayopectate, Pepto Bismol) can help.  If this worsens or does not improve, please call us. °6. Wash / shower every day.  You may shower over the dressings as they are waterproof.  Continue to shower over incision(s) after the dressing is off. °7. Remove your waterproof bandages 5 days after surgery.  You may leave the incision open to air.  You may replace a dressing/Band-Aid to cover the incision for comfort if you wish.  °8. ACTIVITIES as tolerated:   °a. You may resume regular (light) daily activities beginning the next day--such as daily self-care, walking, climbing stairs--gradually increasing activities as tolerated.  If you can walk 30 minutes without difficulty, it is safe to try more intense activity such as   jogging, treadmill, bicycling, low-impact aerobics, swimming, etc. b. Save the most intensive and strenuous activity for last such as sit-ups, heavy lifting, contact sports, etc  Refrain from any heavy lifting or straining until you are off narcotics for pain control.   c. DO NOT PUSH THROUGH PAIN.  Let pain be your guide: If it hurts to do something, don't do it.  Pain is your body warning you to avoid that  activity for another week until the pain goes down. d. You may drive when you are no longer taking prescription pain medication, you can comfortably wear a seatbelt, and you can safely maneuver your car and apply brakes. e. Bonita QuinYou may have sexual intercourse when it is comfortable.  9. FOLLOW UP in our office a. Please call [facility] at [phone] to set up an appointment to see your surgeon in the office for a follow-up appointment approximately 2-3 weeks after your surgery. b. Make sure that you call for this appointment the day you arrive home to insure a convenient appointment time. 10. IF YOU HAVE DISABILITY OR FAMILY LEAVE FORMS, BRING THEM TO THE OFFICE FOR PROCESSING.  DO NOT GIVE THEM TO YOUR DOCTOR.   WHEN TO CALL US [phone]: 1. Poor pain control 2. Reactions / problems with new medications (rash/itching, nausea, etc)  3. Fever over 101.5 F (38.5 C) 4. Inability to urinate 5. Nausea and/or vomiting 6. Worsening swelling or bruising 7. Continued bleeding from incision. 8. Increased pain, redness, or drainage from the incision   The clinic staff is available to answer your questions during regular business hours (8:30am-5pm).  Please dont hesitate to call and ask to speak to one of our nurses for clinical concerns.   If you have a medical emergency, go to the nearest emergency room or call 911.  [facility] is always on call at the [facility]   [facility], [place] [address], [address], [place], [place]  [address] ? MAIN: [phone] ? TOLL FREE: [phone] ?  FAX [phone] [email or web address]     Appendicitis Introduction The appendix is a tube that is shaped like a finger. It is connected to the large intestine. Appendicitis means that this tube is swollen (inflamed). Without treatment, the tube can tear (rupture). This can lead to a life-threatening infection. It can also cause you to have sores  (abscesses). These sores hurt. What are the causes? This condition may be caused by something that blocks the appendix, such as:  A ball of poop (stool).  Lymph glands that are bigger than normal. Sometimes, the cause is not known. What are the signs or symptoms? Symptoms of this condition include:  Pain around the belly button (navel).  The pain moves toward the lower right belly (abdomen).  The pain can get worse with time.  The pain can get worse if you cough.  The pain can get worse if you move suddenly.  Tenderness in the lower right belly.  Feeling sick to your stomach (nauseous).  Throwing up (vomiting).  Not feeling hungry (loss of appetite).  A fever.  Having a hard time pooping (constipation).  Watery poop (diarrhea).  Not feeling well. How is this treated? Usually, this condition is treated by taking out the appendix (appendectomy). There are two ways that the appendix can be taken out:  Open surgery. In this surgery, the appendix is taken out through a large cut (incision). The cut is made in the lower right belly.  This surgery may be picked if:  You have scars from another surgery.  You have a bleeding condition.  You are pregnant and will be having your baby soon.  You have a condition that does not allow the other type of surgery.  Laparoscopic surgery. In this surgery, the appendix is taken out through small cuts. Often, this surgery:  Causes less pain.  Causes fewer problems.  Is easier to heal from. If your appendix tears and a sore forms:  A drain may be put into the sore. The drain will be used to get rid of fluid.  You may get an antibiotic medicine through an IV tube.  Your appendix may or may not need to be taken out. This information is not intended to replace advice given to you by your health care provider. Make sure you discuss any questions you have with your health care provider. Document Released: 12/21/2011 Document  Revised: 03/05/2016 Document Reviewed: 02/13/2015  2017 Elsevier

## 2016-11-15 NOTE — Progress Notes (Signed)
Patient discharged to home with instructions and prescription. 

## 2016-11-18 NOTE — Discharge Summary (Signed)
Physician Discharge Summary  Patient ID: Colton DowseShawn W Rua MRN: [id number] DOB/AGE: 26/01/1991 26 y.o.  Admit date: 11/14/2016 Discharge date: 11/18/2016  Admission Diagnoses:  Acute appendicitis Tobacco use  Discharge Diagnoses:  Same  Active Problems:   Acute appendicitis   PROCEDURES: Laparoscopic Append[facility], 11/14/16, [facility]   Hospital Course:    otherwise healthy 25yo who awoke with generalized abdominal pain this morning. The pain gradually localized to the right lower quadrant. Anorexia. Nausea and emesis. CT consistent with acute appendicitis.   He works at [employer]. Smokes about 1ppd. He was seen in the Ed and admitted to the hospital.  She took him to the OR that evening and underwent appendectomy.  The following AM he was mobilizing well and was discharged home by Dr. Maisie Fushomas.  I did not see the patient.  Information is from the chart.    Condition on D/C:  Improved  CBC Latest Ref Rng & Units 11/14/2016 10/19/2015  WBC 4.0 - 10.5 K/uL 9.2 14.3(H)  Hemoglobin 13.0 - 17.0 g/dL 40.916.2 17.7(H)  Hematocrit 39.0 - 52.0 % 47.0 49.6  Platelets 150 - 400 K/uL 207 199    CMP Latest Ref Rng & Units 11/14/2016 10/19/2015  Glucose 65 - 99 mg/dL 811(B123(H) 147(W100(H)  BUN 6 - 20 mg/dL 6 13  Creatinine 2.950.61 - 1.24 mg/dL 6.211.09 3.081.13  Sodium 657135 - 145 mmol/L 137 141  Potassium 3.5 - 5.1 mmol/L 3.8 3.7  Chloride 101 - 111 mmol/L 104 103  CO2 22 - 32 mmol/L 22 24  Calcium 8.9 - 10.3 mg/dL 9.6 9.6  Total Protein 6.5 - 8.1 g/dL 7.3 7.1  Total Bilirubin 0.3 - 1.2 mg/dL 0.7 8.4(O1.7(H)  Alkaline Phos 38 - 126 U/L 50 48  AST 15 - 41 U/L 26 30  ALT 17 - 63 U/L 30 32    Disposition: 01-Home or Self Care  Discharge Instructions    Discharge patient    Complete by:  As directed    Discharge disposition:  01-Home or Self Care   Discharge patient date:  11/15/2016     Allergies as of 11/15/2016   No Known Allergies     Medication List    STOP taking these medications   doxycycline 100 MG  capsule Commonly known as:  VIBRAMYCIN   HYDROcodone-acetaminophen 5-325 MG tablet Commonly known as:  NORCO/VICODIN     TAKE these medications   oxyCODONE 5 MG immediate release tablet Commonly known as:  Oxy IR/ROXICODONE Take 1 tablet (5 mg total) by mouth every 4 (four) hours as needed for severe pain.      Follow-up Information    [facility], [place]. Schedule an appointment as soon as possible for a visit in 2 week(s).   Specialty:  General Surgery Contact information: [address] [place] [id number] [phone]          Signed: Sherrie GeorgeJENNINGS,Joanann Mies 11/18/2016, 2:01 PM

## 2017-08-08 IMAGING — CT CT ABD-PELV W/ CM
2 of 4 series · 16 of 46 positions shown, 18 images · IV contrast (APPLIED)
Comparison: None.

CLINICAL DATA: Acute right lower quadrant abdominal pain, vomiting.

EXAM:
CT ABDOMEN AND PELVIS WITH CONTRAST
TECHNIQUE: Multidetector CT imaging of the abdomen and pelvis was performed
using the standard protocol following bolus administration of
intravenous contrast.
CONTRAST:  100mL LR61DP-6NN IOPAMIDOL (LR61DP-6NN) INJECTION 61%

[Series 2: abd/ pelvis 5.0 i30f 1 · axial · 0.71mm/px · z∈[+859,+1269]mm · 13 of 90 slices shown, 15 images]
[im 4/90  soft-tissue]
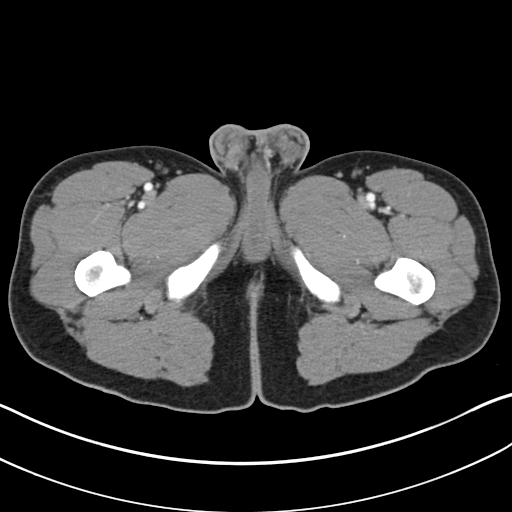
[im 4/90  bone]
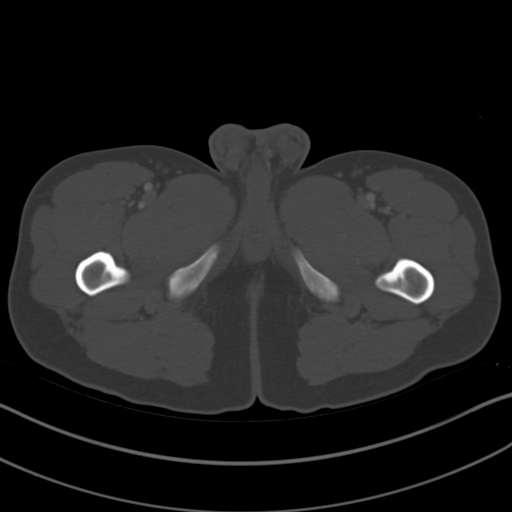
[im 12/90  soft-tissue]
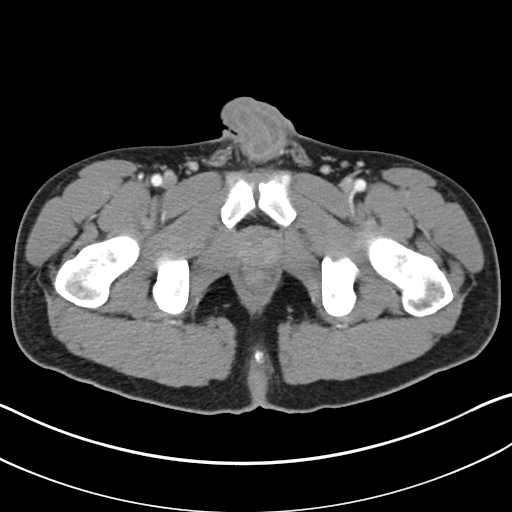
[im 19/90  soft-tissue]
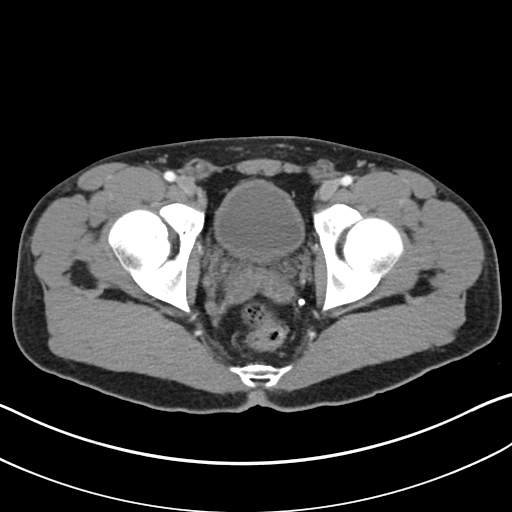
[im 26/90  soft-tissue]
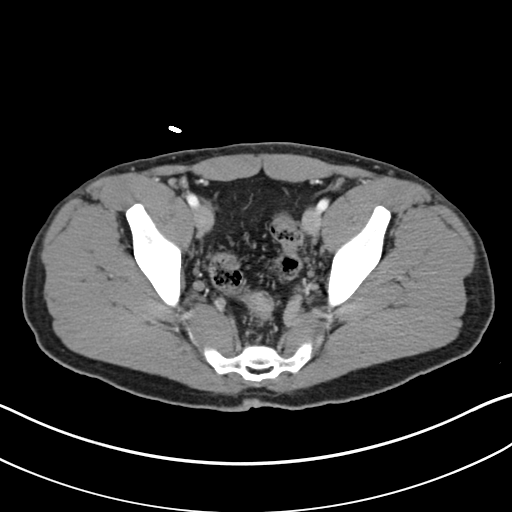
[im 30/90  soft-tissue]
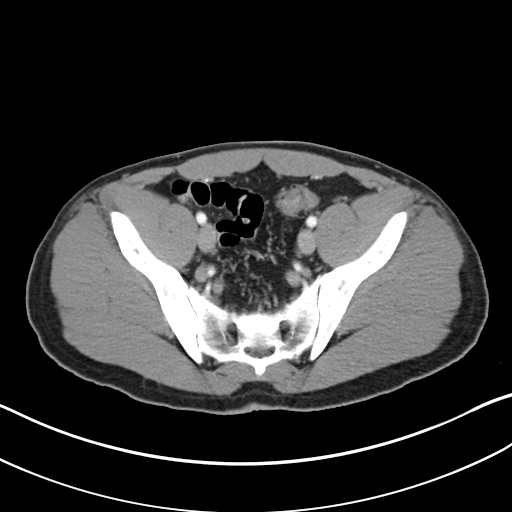
[im 38/90  soft-tissue]
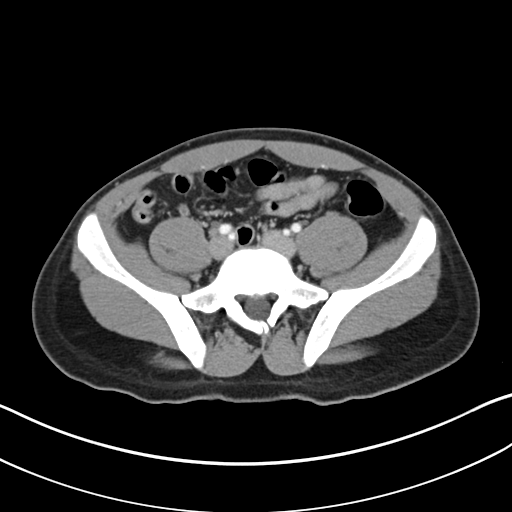
[im 45/90  soft-tissue]
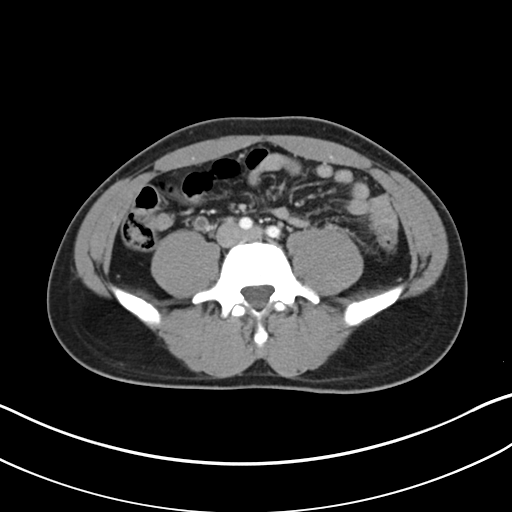
[im 52/90  soft-tissue]
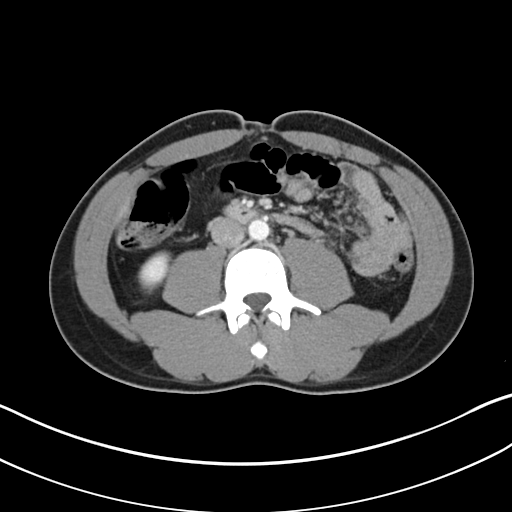
[im 60/90  soft-tissue]
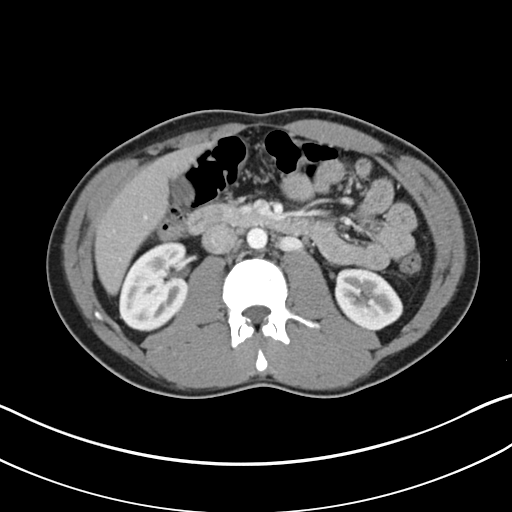
[im 60/90  bone]
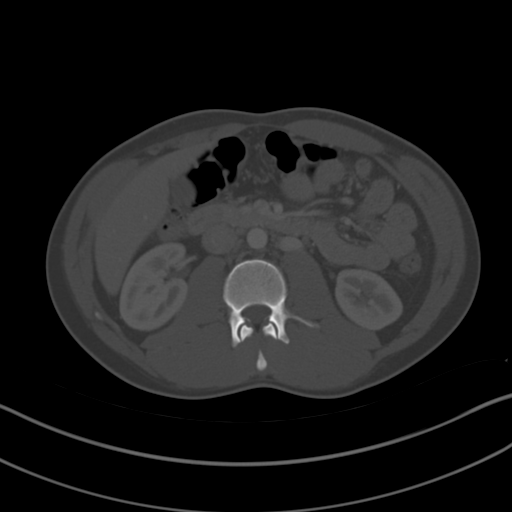
[im 64/90  soft-tissue]
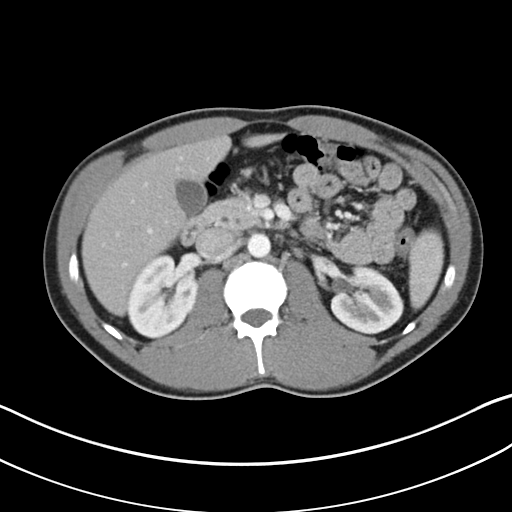
[im 71/90  soft-tissue]
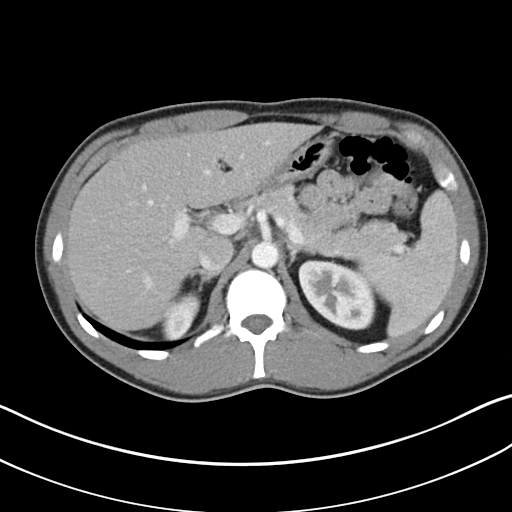
[im 78/90  soft-tissue]
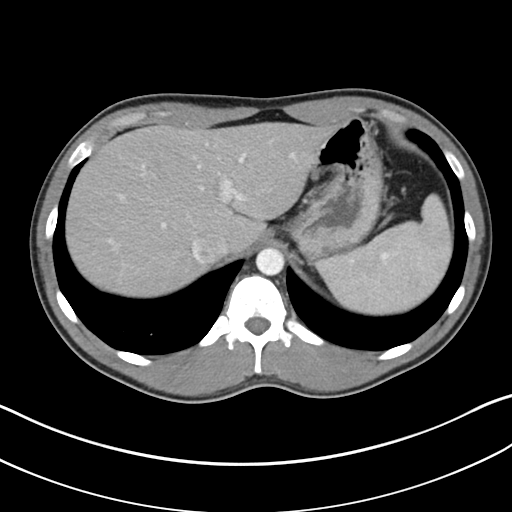
[im 86/90  soft-tissue]
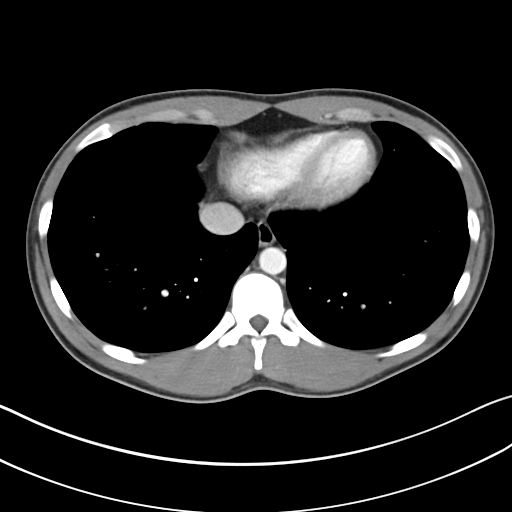

[Series 5: coronal soft tissue · coronal · 0.70mm/px · 3 of 69 slices shown]
[im 23/69  soft-tissue]
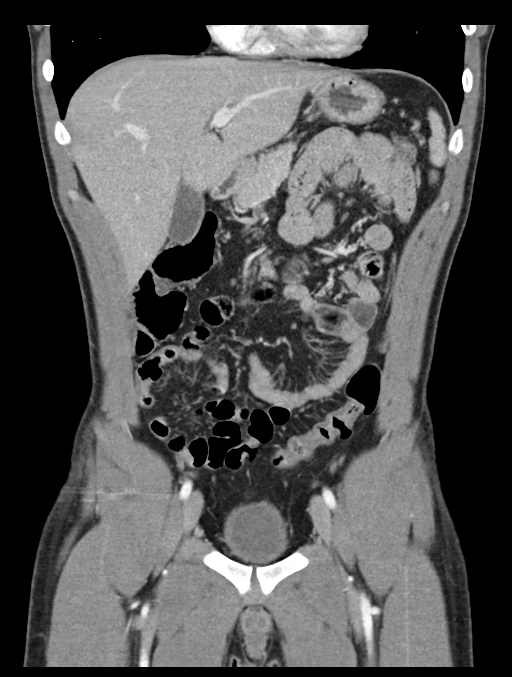
[im 31/69  soft-tissue]
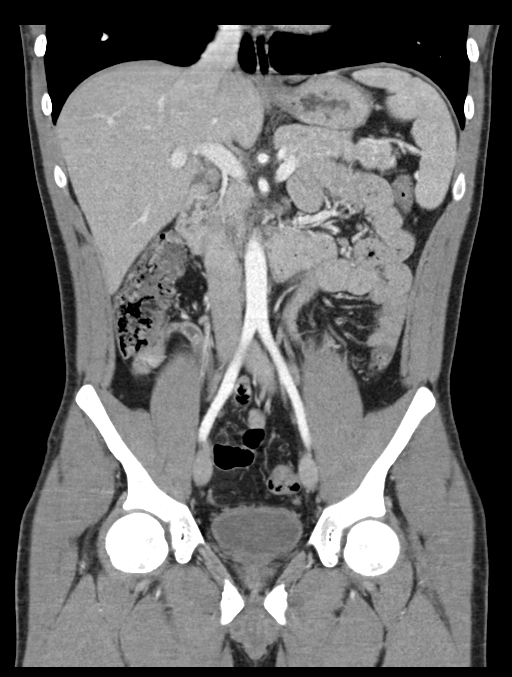
[im 38/69  soft-tissue]
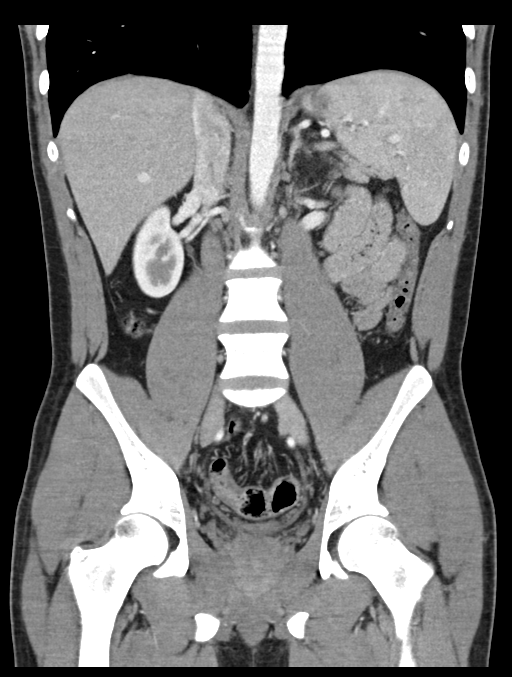

[16 of 46 positions shown; findings below may reference images not displayed]

FINDINGS: Lower chest: No acute abnormality.

Hepatobiliary: No focal liver abnormality is seen. No gallstones,
gallbladder wall thickening, or biliary dilatation.

Pancreas: Unremarkable. No pancreatic ductal dilatation or
surrounding inflammatory changes.

Spleen: Normal in size without focal abnormality.

Adrenals/Urinary Tract: Adrenal glands are unremarkable. Kidneys are
normal, without renal calculi, focal lesion, or hydronephrosis.
Bladder is unremarkable.

Stomach/Bowel: There is no evidence of bowel obstruction. The
appendix is enlarged and fluid filled, measuring 11 mm in diameter.
Appendicolith is noted. Although does not appear to be a significant
amount of surrounding inflammation, this is concerning for acute
appendicitis given the patient's clinical history.

Vascular/Lymphatic: No significant vascular findings are present. No
enlarged abdominal or pelvic lymph nodes.

Reproductive: Prostate is unremarkable.

Other: No abdominal wall hernia or abnormality. No abdominopelvic
ascites.

Musculoskeletal: No acute or significant osseous findings.
IMPRESSION: The appendix is significantly enlarged and fluid-filled, with small
appendicolith present. Although there does not appear to be a
significant amount of surrounding inflammation, this is concerning
for acute appendicitis given the patient's clinical history.

## 2017-11-28 ENCOUNTER — Encounter (HOSPITAL_COMMUNITY): Payer: Self-pay

## 2017-11-28 ENCOUNTER — Emergency Department (HOSPITAL_COMMUNITY)
Admission: EM | Admit: 2017-11-28 | Discharge: 2017-11-28 | Disposition: A | Discharge status: Home / Self Care | Payer: Self-pay | Attending: Emergency Medicine | Admitting: Emergency Medicine

## 2017-11-28 ENCOUNTER — Other Ambulatory Visit: Payer: Self-pay

## 2017-11-28 DIAGNOSIS — J101 Influenza due to other identified influenza virus with other respiratory manifestations: Secondary | ICD-10-CM | POA: Insufficient documentation

## 2017-11-28 DIAGNOSIS — F17200 Nicotine dependence, unspecified, uncomplicated: Secondary | ICD-10-CM | POA: Insufficient documentation

## 2017-11-28 DIAGNOSIS — J111 Influenza due to unidentified influenza virus with other respiratory manifestations: Secondary | ICD-10-CM

## 2017-11-28 LAB — CBC
HEMATOCRIT: 46.9 % (ref 39.0–52.0)
Hemoglobin: 16.1 g/dL (ref 13.0–17.0)
MCH: 29 pg (ref 26.0–34.0)
MCHC: 34.3 g/dL (ref 30.0–36.0)
MCV: 84.5 fL (ref 78.0–100.0)
PLATELETS: 168 10*3/uL (ref 150–400)
RBC: 5.55 MIL/uL (ref 4.22–5.81)
RDW: 12.9 % (ref 11.5–15.5)
WBC: 6.1 10*3/uL (ref 4.0–10.5)

## 2017-11-28 LAB — COMPREHENSIVE METABOLIC PANEL
ALT: 32 U/L (ref 17–63)
AST: 27 U/L (ref 15–41)
Albumin: 4.3 g/dL (ref 3.5–5.0)
Alkaline Phosphatase: 47 U/L (ref 38–126)
Anion gap: 10 (ref 5–15)
BUN: 7 mg/dL (ref 6–20)
CHLORIDE: 100 mmol/L — AB (ref 101–111)
CO2: 22 mmol/L (ref 22–32)
Calcium: 8.7 mg/dL — ABNORMAL LOW (ref 8.9–10.3)
Creatinine, Ser: 1.11 mg/dL (ref 0.61–1.24)
Glucose, Bld: 105 mg/dL — ABNORMAL HIGH (ref 65–99)
POTASSIUM: 3.6 mmol/L (ref 3.5–5.1)
Sodium: 132 mmol/L — ABNORMAL LOW (ref 135–145)
Total Bilirubin: 0.6 mg/dL (ref 0.3–1.2)
Total Protein: 6.9 g/dL (ref 6.5–8.1)

## 2017-11-28 LAB — INFLUENZA PANEL BY PCR (TYPE A & B)
Influenza A By PCR: POSITIVE — AB
Influenza B By PCR: NEGATIVE

## 2017-11-28 MED ORDER — OSELTAMIVIR PHOSPHATE 75 MG PO CAPS: 75.0000 mg | ORAL_CAPSULE | Freq: Two times a day (BID) | ORAL | 0 refills | Status: DC

## 2017-11-28 MED ORDER — BENZONATATE 100 MG PO CAPS: 100.0000 mg | ORAL_CAPSULE | Freq: Three times a day (TID) | ORAL | 0 refills | Status: DC

## 2017-11-28 NOTE — ED Triage Notes (Signed)
Pt reports cough, congestion, body aches and chills X3 days. Pt reports today he began having diarrhea, abd pain, and headache. Skin warm and dry. Vitals WNL.

## 2017-11-28 NOTE — ED Provider Notes (Signed)
[facility] EMERGENCY DEPARTMENT Provider Note   CSN: [id number] Arrival date & time: 11/28/17  1741     History   Chief Complaint Chief Complaint  Patient presents with  . Influenza    HPI Col[facility] Hicks is a 27 y.o. male who presents to the ED with flu like symptoms that started 3 days ago. Patient reports symptoms started with a runny nose, sore throat, fever and chills. Patient reports being in contact with someone who tested positive for the flu.  The history is provided by the patient.  Influenza  Presenting symptoms: cough, diarrhea, headache, myalgias, nausea and sore throat   Onset quality:  Gradual Duration:  3 days Progression:  Worsening Relieved by:  Nothing Ineffective treatments:  OTC medications Associated symptoms: chills   Associated symptoms: no ear pain   Risk factors: sick contacts     History reviewed. No pertinent past medical history.  Patient Active Problem List   Diagnosis Date Noted  . Acute appendicitis 11/14/2016    Past Surgical History:  Procedure Laterality Date  . LAPAROSCOPIC APPENDECTOMY N/A 11/14/2016   Procedure: APPENDECTOMY LAPAROSCOPIC;  Surgeon: Berna Bue, MD;  Location: [facility] OR;  Service: General;  Laterality: N/A;       Home Medications    Prior to Admission medications   Medication Sig Start Date End Date Taking? Authorizing Provider  benzonatate (TESSALON) 100 MG capsule Take 1 capsule (100 mg total) by mouth every 8 (eight) hours. 11/28/17   Colton Napoleon, NP  oseltamivir (TAMIFLU) 75 MG capsule Take 1 capsule (75 mg total) by mouth every 12 (twelve) hours. 11/28/17   Colton Napoleon, NP  oxyCODONE (OXY IR/ROXICODONE) 5 MG immediate release tablet Take 1 tablet (5 mg total) by mouth every 4 (four) hours as needed for severe pain. 11/15/16   Colton Levee, MD    Family History History reviewed. No pertinent family history.  Social History Social History   Tobacco Use  . Smoking status: Current  Every Day Smoker  . Smokeless tobacco: Never Used  Substance Use Topics  . Alcohol use: No  . Drug use: No     Allergies   Patient has no known allergies.   Review of Systems Review of Systems  Constitutional: Positive for chills.  HENT: Positive for sore throat. Negative for ear pain.   Eyes: Negative for pain, discharge, redness and visual disturbance.  Respiratory: Positive for cough. Negative for wheezing.   Cardiovascular: Negative for chest pain.  Gastrointestinal: Positive for diarrhea and nausea. Negative for abdominal pain.  Musculoskeletal: Positive for myalgias.  Skin: Negative for rash.  Neurological: Positive for headaches.  Hematological: Negative for adenopathy.  Psychiatric/Behavioral: Negative for confusion.     Physical Exam Updated Vital Signs BP 107/70   Pulse 72   Temp 99.3 F (37.4 C)   Resp 18   SpO2 95%   Physical Exam  Constitutional: He appears well-developed and well-nourished. No distress.  HENT:  Head: Normocephalic and atraumatic.  Right Ear: Tympanic membrane normal.  Left Ear: Tympanic membrane normal.  Nose: Rhinorrhea present.  Mouth/Throat: Uvula is midline, oropharynx is clear and moist and mucous membranes are normal.  Eyes: Conjunctivae and EOM are normal. Pupils are equal, round, and reactive to light.  Neck: Normal range of motion. Neck supple.  No meningeal signs  Cardiovascular: Normal rate and regular rhythm.  Pulmonary/Chest: Effort normal and breath sounds normal.  Abdominal: Soft. There is no tenderness.  Musculoskeletal: Normal range of  motion.  Neurological: He is alert.  Skin: Skin is warm and dry.  Nursing note and vitals reviewed.    ED Treatments / Results  Labs (all labs ordered are listed, but only abnormal results are displayed) Labs Reviewed  COMPREHENSIVE METABOLIC PANEL - Abnormal; Notable for the following components:      Result Value   Sodium 132 (*)    Chloride 100 (*)    Glucose, Bld 105  (*)    Calcium 8.7 (*)    All other components within normal limits  INFLUENZA PANEL BY PCR (TYPE A & B) - Abnormal; Notable for the following components:   Influenza A By PCR POSITIVE (*)    All other components within normal limits  CBC   Radiology No results found.  Procedures Procedures (including critical care time)  Medications Ordered in ED Medications - No data to display   Initial Impression / Assessment and Plan / ED Course  I have reviewed the triage vital signs and the nursing notes. SUBJECTIVE:  Colton DowseShawn W Hicks is a 27 y.o. male who present complaining of flu-like symptoms: fevers, chills, myalgias, congestion, sore throat and cough for 3 days. Denies dyspnea or wheezing.  OBJECTIVE: Appears moderately ill but not toxic; temperature as noted in vitals. Ears normal. Throat and pharynx normal.  Neck supple. No adenopathy in the neck. Sinuses non tender. The chest is clear.  ASSESSMENT: Influenza  PLAN: Symptomatic therapy suggested: gargle prn for sore throat, use mist of vaporizer prn. Return for worsening symptoms.    Final Clinical Impressions(s) / ED Diagnoses   Final diagnoses:  Influenza    ED Discharge Orders        Ordered    oseltamivir (TAMIFLU) 75 MG capsule  Every 12 hours     11/28/17 2059    benzonatate (TESSALON) 100 MG capsule  Every 8 hours     11/28/17 2059       Kerrie Buffaloeese, Thoms Barthelemy BoazM, [place] 11/28/17 2150    Benjiman CorePickering, Nathan, MD 11/28/17 2239

## 2017-11-28 NOTE — ED Notes (Signed)
Lab called to send more flu swabs

## 2017-12-31 ENCOUNTER — Emergency Department (HOSPITAL_COMMUNITY)
Admission: EM | Admit: 2017-12-31 | Discharge: 2018-01-01 | Disposition: A | Discharge status: Home / Self Care | Payer: Self-pay | Attending: Emergency Medicine | Admitting: Emergency Medicine

## 2017-12-31 ENCOUNTER — Other Ambulatory Visit: Payer: Self-pay

## 2017-12-31 DIAGNOSIS — G44319 Acute post-traumatic headache, not intractable: Secondary | ICD-10-CM | POA: Insufficient documentation

## 2017-12-31 DIAGNOSIS — S8002XA Contusion of left knee, initial encounter: Secondary | ICD-10-CM | POA: Insufficient documentation

## 2017-12-31 DIAGNOSIS — S60511A Abrasion of right hand, initial encounter: Secondary | ICD-10-CM | POA: Insufficient documentation

## 2017-12-31 DIAGNOSIS — S40212A Abrasion of left shoulder, initial encounter: Secondary | ICD-10-CM | POA: Insufficient documentation

## 2017-12-31 DIAGNOSIS — Y998 Other external cause status: Secondary | ICD-10-CM | POA: Insufficient documentation

## 2017-12-31 DIAGNOSIS — S50312A Abrasion of left elbow, initial encounter: Secondary | ICD-10-CM | POA: Insufficient documentation

## 2017-12-31 DIAGNOSIS — S098XXA Other specified injuries of head, initial encounter: Secondary | ICD-10-CM | POA: Diagnosis not present

## 2017-12-31 DIAGNOSIS — Y9389 Activity, other specified: Secondary | ICD-10-CM | POA: Insufficient documentation

## 2017-12-31 DIAGNOSIS — S0990XA Unspecified injury of head, initial encounter: Secondary | ICD-10-CM | POA: Insufficient documentation

## 2017-12-31 DIAGNOSIS — Z23 Encounter for immunization: Secondary | ICD-10-CM | POA: Insufficient documentation

## 2017-12-31 DIAGNOSIS — T07XXXA Unspecified multiple injuries, initial encounter: Secondary | ICD-10-CM

## 2017-12-31 DIAGNOSIS — S90412A Abrasion, left great toe, initial encounter: Secondary | ICD-10-CM | POA: Insufficient documentation

## 2017-12-31 DIAGNOSIS — S0181XA Laceration without foreign body of other part of head, initial encounter: Secondary | ICD-10-CM | POA: Diagnosis not present

## 2017-12-31 DIAGNOSIS — Y929 Unspecified place or not applicable: Secondary | ICD-10-CM | POA: Insufficient documentation

## 2017-12-31 DIAGNOSIS — S0101XA Laceration without foreign body of scalp, initial encounter: Secondary | ICD-10-CM | POA: Insufficient documentation

## 2017-12-31 DIAGNOSIS — S8001XA Contusion of right knee, initial encounter: Secondary | ICD-10-CM | POA: Insufficient documentation

## 2017-12-31 DIAGNOSIS — Z79899 Other long term (current) drug therapy: Secondary | ICD-10-CM | POA: Insufficient documentation

## 2017-12-31 DIAGNOSIS — F17200 Nicotine dependence, unspecified, uncomplicated: Secondary | ICD-10-CM | POA: Insufficient documentation
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
## 2018-01-01 ENCOUNTER — Emergency Department (HOSPITAL_COMMUNITY): Payer: Self-pay

## 2018-01-01 MED ORDER — KETOROLAC TROMETHAMINE 30 MG/ML IJ SOLN
30.0000 mg | Freq: Once | INTRAMUSCULAR | Status: AC
  Administered 2018-01-01: 30 mg via INTRAVENOUS
  Filled 2018-01-01: qty 1

## 2018-01-01 MED ORDER — TETANUS-DIPHTH-ACELL PERTUSSIS 5-2.5-18.5 LF-MCG/0.5 IM SUSP
0.5000 mL | Freq: Once | INTRAMUSCULAR | Status: AC
  Administered 2018-01-01: 0.5 mL via INTRAMUSCULAR
  Filled 2018-01-01: qty 0.5

## 2018-01-01 MED ORDER — FENTANYL CITRATE (PF) 100 MCG/2ML IJ SOLN
50.0000 ug | Freq: Once | INTRAMUSCULAR | Status: AC
  Administered 2018-01-01: 50 ug via INTRAVENOUS
  Filled 2018-01-01: qty 2
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
## 2018-01-01 NOTE — ED Notes (Signed)
Pt ambulating with steady gait. Denies dizziness or weakness.

## 2018-01-01 NOTE — Discharge Instructions (Addendum)
1. Medications: Tylenol or ibuprofen for pain, usual home medications °2. Treatment: ice for swelling, keep wound clean with warm soap and water and keep bandage dry, do not submerge in water for 24 hours °3. Follow Up: Please return in 5-7 days to have your stitches/staples removed or sooner if you have concerns. Return to the emergency department for increased redness, drainage of pus from the wound ° ° °WOUND CARE °• Keep area clean and dry for 24 hours. Do not remove bandage, if applied. °• After 24 hours, remove bandage and wash wound gently with mild soap and warm water. Reapply a new bandage after cleaning wound, if directed.  °• Continue daily cleansing with soap and water until stitches/staples are removed. °• Do not apply any ointments or creams to the wound while stitches/staples are in place, as this may cause delayed healing. °•Return if you experience any of the following signs of infection: Swelling, redness, pus drainage, streaking, fever >101.0 F °• Return if you experience excessive bleeding that does not stop after 15-20 minutes of constant, firm pressure. ° °

## 2018-01-01 NOTE — ED Notes (Signed)
Patient transported to CT/xray 

## 2018-01-12 ENCOUNTER — Encounter (HOSPITAL_COMMUNITY): Payer: Self-pay

## 2018-01-12 ENCOUNTER — Emergency Department (HOSPITAL_COMMUNITY)
Admission: EM | Admit: 2018-01-12 | Discharge: 2018-01-12 | Disposition: A | Discharge status: Home / Self Care | Payer: Self-pay | Attending: Emergency Medicine | Admitting: Emergency Medicine

## 2018-01-12 DIAGNOSIS — Z79899 Other long term (current) drug therapy: Secondary | ICD-10-CM | POA: Insufficient documentation

## 2018-01-12 DIAGNOSIS — S0101XD Laceration without foreign body of scalp, subsequent encounter: Secondary | ICD-10-CM | POA: Insufficient documentation

## 2018-01-12 DIAGNOSIS — Z4802 Encounter for removal of sutures: Secondary | ICD-10-CM

## 2018-01-12 DIAGNOSIS — F17200 Nicotine dependence, unspecified, uncomplicated: Secondary | ICD-10-CM | POA: Insufficient documentation

## 2018-01-12 NOTE — Discharge Instructions (Signed)
Keep your wound clean. You can apply antibiotic ointment to help prevent scarring.
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
## 2018-01-12 NOTE — ED Triage Notes (Signed)
Pt reports needing staple removal on his head, no other complaints

## 2018-08-09 ENCOUNTER — Other Ambulatory Visit: Payer: Self-pay

## 2018-08-09 ENCOUNTER — Emergency Department (HOSPITAL_COMMUNITY)
Admission: EM | Admit: 2018-08-09 | Discharge: 2018-08-09 | Disposition: A | Discharge status: Home / Self Care | Payer: Self-pay | Attending: Emergency Medicine | Admitting: Emergency Medicine

## 2018-08-09 ENCOUNTER — Encounter (HOSPITAL_COMMUNITY): Payer: Self-pay | Admitting: Emergency Medicine

## 2018-08-09 DIAGNOSIS — F17210 Nicotine dependence, cigarettes, uncomplicated: Secondary | ICD-10-CM | POA: Insufficient documentation

## 2018-08-09 DIAGNOSIS — J029 Acute pharyngitis, unspecified: Secondary | ICD-10-CM

## 2018-08-09 DIAGNOSIS — J069 Acute upper respiratory infection, unspecified: Secondary | ICD-10-CM | POA: Insufficient documentation

## 2018-08-09 LAB — GROUP A STREP BY PCR: Group A Strep by PCR: NOT DETECTED

## 2018-08-09 MED ORDER — DEXAMETHASONE SODIUM PHOSPHATE 10 MG/ML IJ SOLN
10.0000 mg | Freq: Once | INTRAMUSCULAR | Status: AC
  Administered 2018-08-09: 10 mg via INTRAMUSCULAR
  Filled 2018-08-09: qty 1

## 2018-08-09 NOTE — ED Provider Notes (Signed)
[facility] EMERGENCY DEPARTMENT Provider Note   CSN: [id number] Arrival date & time: 08/09/18  1736     History   Chief Complaint Chief Complaint  Patient presents with  . Sore Throat    HPI Colton Hicks is a 27 y.o. male.  Patient reports URI symptoms x 1 week with runny nose, occasional cough, and sore throat. No ear pain. No abdominal pain, nausea, or vomiting.   The history is provided by the patient. No language interpreter was used.  Sore Throat  This is a new problem. The current episode started more than 1 week ago. The problem occurs constantly. The problem has not changed since onset.   History reviewed. No pertinent past medical history.  There are no active problems to display for this patient.   History reviewed. No pertinent surgical history.      Home Medications    Prior to Admission medications   Not on File    Family History No family history on file.  Social History Social History   Tobacco Use  . Smoking status: Current Every Day Smoker    Packs/day: 0.50    Types: Cigarettes  . Smokeless tobacco: Never Used  Substance Use Topics  . Alcohol use: Yes    Comment: occ  . Drug use: Never     Allergies   Patient has no known allergies.   Review of Systems Review of Systems  HENT: Positive for congestion, postnasal drip, rhinorrhea and sore throat. Negative for voice change.   Respiratory: Positive for cough.   All other systems reviewed and are negative.    Physical Exam Updated Vital Signs BP 123/84 (BP Location: Right Arm)   Pulse 71   Temp 98.7 F (37.1 C) (Oral)   Resp 16   Ht 5\' 8"  (1.727 m)   Wt 63.5 kg   SpO2 100%   BMI 21.29 kg/m   Physical Exam  Constitutional: He is oriented to person, place, and time. He appears well-developed and well-nourished. He does not appear ill.  HENT:  Head: Atraumatic.  Right Ear: Tympanic membrane normal.  Left Ear: Tympanic membrane normal.    Mouth/Throat: Mucous membranes are normal. Posterior oropharyngeal erythema present. No oropharyngeal exudate.  Eyes: EOM are normal.  Neck: Normal range of motion.  Cardiovascular: Normal rate and normal heart sounds.  Pulmonary/Chest: Effort normal and breath sounds normal.  Abdominal: Soft. Bowel sounds are normal.  Neurological: He is alert and oriented to person, place, and time.  Skin: Skin is dry. No rash noted.  Psychiatric: He has a normal mood and affect.  Nursing note and vitals reviewed.    ED Treatments / Results  Labs (all labs ordered are listed, but only abnormal results are displayed) Labs Reviewed  GROUP A STREP BY PCR    EKG None  Radiology No results found.  Procedures Procedures (including critical care time)  Medications Ordered in ED Medications  dexamethasone (DECADRON) injection 10 mg (10 mg Intramuscular Given 08/09/18 2043)     Initial Impression / Assessment and Plan / ED Course  I have reviewed the triage vital signs and the nursing notes.  Pertinent labs & imaging results that were available during my care of the patient were reviewed by me and considered in my medical decision making (see chart for details).     Pt symptoms consistent with URI. Pt with negative strep. Discharge with symptomatic tx. No evidence of dehydration. Pt is tolerating secretions. Presentation not concerning for peritonsillar  abscess or spread of infection to deep spaces of the throat; patent airway. Specific return precautions discussed. Recommended PCP follow up. Pt appears safe for discharge.    Final Clinical Impressions(s) / ED Diagnoses   Final diagnoses:  Sore throat  Viral upper respiratory tract infection    ED Discharge Orders    None       Felicie Morn, NP 08/09/18 2103    Virgina Norfolk, DO 08/10/18 1610

## 2018-08-09 NOTE — ED Provider Notes (Signed)
Patient placed in Quick Look pathway, seen and evaluated   Chief Complaint: Sore throat  HPI:  Sore throat started three days ago. Pain is an 8/10 in severity. Tried OTC throat spray which didn't help. No sick contacts with similar. Associated nasal drainage and coughing up phlegm.   ROS: + Lymphadenopathy. - Fevers  Physical Exam:   Gen: No distress  Neuro: Awake and Alert  Skin: Warm    Focused Exam: Mucous membranes moist. Posterior oropharynx erythematous. Uvula midline. No tonsillar exudate.    Initiation of care has begun. The patient has been counseled on the process, plan, and necessity for staying for the completion/evaluation, and the remainder of the medical screening examination    Lawrence Marseilles 08/09/18 1800    Eber Hong, MD 08/11/18 1700

## 2018-08-09 NOTE — ED Notes (Signed)
ED Provider at bedside. 

## 2018-08-09 NOTE — ED Triage Notes (Signed)
Pt reports bad sore throat with swollen lymph nodes, nasal drainage, and productive cough with yellow/green phlegm that started 3 days ago. Painful to swallow. Pt has taken otc medications without any relief.

## 2018-10-18 ENCOUNTER — Encounter (HOSPITAL_COMMUNITY): Payer: Self-pay

## 2019-08-16 ENCOUNTER — Other Ambulatory Visit: Payer: Self-pay

## 2019-08-16 ENCOUNTER — Ambulatory Visit (HOSPITAL_COMMUNITY)
Admission: EM | Admit: 2019-08-16 | Discharge: 2019-08-16 | Disposition: A | Discharge status: Home / Self Care | Payer: 59 | Attending: Family Medicine | Admitting: Family Medicine

## 2019-08-16 ENCOUNTER — Encounter (HOSPITAL_COMMUNITY): Payer: Self-pay

## 2019-08-16 DIAGNOSIS — F17210 Nicotine dependence, cigarettes, uncomplicated: Secondary | ICD-10-CM

## 2019-08-16 DIAGNOSIS — R05 Cough: Secondary | ICD-10-CM | POA: Diagnosis not present

## 2019-08-16 DIAGNOSIS — R5383 Other fatigue: Secondary | ICD-10-CM | POA: Diagnosis not present

## 2019-08-16 DIAGNOSIS — R519 Headache, unspecified: Secondary | ICD-10-CM | POA: Insufficient documentation

## 2019-08-16 DIAGNOSIS — R0981 Nasal congestion: Secondary | ICD-10-CM | POA: Insufficient documentation

## 2019-08-16 DIAGNOSIS — J069 Acute upper respiratory infection, unspecified: Secondary | ICD-10-CM

## 2019-08-16 DIAGNOSIS — Z20828 Contact with and (suspected) exposure to other viral communicable diseases: Secondary | ICD-10-CM

## 2019-08-16 NOTE — Discharge Instructions (Addendum)
If your Covid-19 test is positive, you will get a phone call from [facility] [place] regarding your results. If your Covid-19 test is negative, you will NOT get a phone call from [place] with your results. You may view your results on MyChart. If you do not have a MyChart account, sign up instructions are in your discharge papers. ° ° °Be aware, your cough medication may cause drowsiness. Please do not drive, operate heavy machinery or make important decisions while on this medication, it can cloud your judgement. ° °Follow up with your primary care doctor or here if you are not seeing improvement of your symptoms over the next several days, sooner if you feel you are worsening. ° °Caring for yourself: °Get plenty of rest. °Drink plenty of fluids, enough so that your urine is light yellow or clear like water. If you have kidney, heart, or liver disease and have to limit fluids, talk with your doctor before you increase the amount of fluids you drink. °Take an over-the-counter pain medicine if needed, such as acetaminophen (Tylenol), ibuprofen (Advil, Motrin), or naproxen (Aleve), to relieve fever, headache, and muscle aches. Read and follow all instructions on the label. No one younger than 20 should take aspirin. It has been linked to Reye syndrome, a serious illness. °Before you use over the counter cough and cold medicines, check the label. These medicines may not be safe for children younger than age 6 or for people with certain health problems. °If the skin around your nose and lips becomes sore, put some petroleum jelly on the area. ° °Avoid spreading a viral illness: °Wash your hands regularly, and keep your hands away from your face.  °Stay home from school, work, and other public places until you are feeling better and your fever has been gone for at least 24 hours. The fever needs to have gone away on its own without the help of medicine. ° °

## 2019-08-16 NOTE — ED Triage Notes (Signed)
Patient presents to Urgent Care with complaints of runny nose and headache since 3 days ago. Patient reports he needs a covid test and work note per his employer.

## 2019-08-18 LAB — NOVEL CORONAVIRUS, NAA (HOSP ORDER, SEND-OUT TO REF LAB; TAT 18-24 HRS): SARS-CoV-2, NAA: NOT DETECTED

## 2019-08-19 NOTE — ED Provider Notes (Signed)
[facility] CARE CENTER   [id number] 08/16/19 Arrival Time: 1703  ASSESSMENT & PLAN:  1. Viral URI with cough     See AVS for discharge instructions. COVID testing sent. To self-quarantine until results available. Work note provided.   OTC symptom care as needed. Ensure adequate fluid intake and rest. May f/u with PCP or here as needed.  Reviewed expectations re: course of current medical issues. Questions answered. Outlined signs and symptoms indicating need for more acute intervention. Patient verbalized understanding. After Visit Summary given.   SUBJECTIVE: History from: patient.  Smitty Gibril Mastro is a 28 y.o. male who presents with complaint of mild nasal congestion and headache; without sore throat or cough. Onset abrupt, 3 d ago; with fatigue and without body aches. SOB: none. Wheezing: none. Fever: absent. Overall normal PO intake without n/v. Known sick contacts or COVID-19 exposure: no. No specific or significant aggravating or alleviating factors reported. OTC treatment: none reported.  Received flu shot this year: no.  Social History   Tobacco Use  Smoking Status Current Every Day Smoker  . Packs/day: 0.50  . Types: Cigarettes  Smokeless Tobacco Never Used    ROS: As per HPI.   OBJECTIVE:  Vitals:   08/16/19 1724  BP: 128/70  Pulse: 68  Resp: 16  Temp: 98 F (36.7 C)  TempSrc: Temporal  SpO2: 99%     General appearance: alert; appears fatigued HEENT: nasal congestion; clear runny nose; throat irritation secondary to post-nasal drainage Neck: supple without LAD CV: RRR Lungs: unlabored respirations, symmetrical air entry without wheezing; cough: absent Abd: soft Ext: no LE edema Skin: warm and dry Psychological: alert and cooperative; normal mood and affect    No Known Allergies   Family History  Problem Relation Age of Onset  . Healthy Mother   . Healthy Father    Social History   Socioeconomic History  . Marital status:  Single    Spouse name: Not on file  . Number of children: Not on file  . Years of education: Not on file  . Highest education level: Not on file  Occupational History  . Not on file  Social Needs  . Financial resource strain: Not on file  . Food insecurity    Worry: Not on file    Inability: Not on file  . Transportation needs    Medical: Not on file    Non-medical: Not on file  Tobacco Use  . Smoking status: Current Every Day Smoker    Packs/day: 0.50    Types: Cigarettes  . Smokeless tobacco: Never Used  Substance and Sexual Activity  . Alcohol use: Yes    Comment: occ  . Drug use: Never  . Sexual activity: Not on file  Lifestyle  . Physical activity    Days per week: Not on file    Minutes per session: Not on file  . Stress: Not on file  Relationships  . Social Musician on phone: Not on file    Gets together: Not on file    Attends religious service: Not on file    Active member of club or organization: Not on file    Attends meetings of clubs or organizations: Not on file    Relationship status: Not on file  . Intimate partner violence    Fear of current or ex partner: Not on file    Emotionally abused: Not on file    Physically abused: Not on file    Forced  sexual activity: Not on file  Other Topics Concern  . Not on file  Social History Narrative   ** Merged History Encounter **               Vanessa Kick, MD 08/19/19 [phone]

## 2019-09-30 ENCOUNTER — Ambulatory Visit (HOSPITAL_COMMUNITY)
Admission: EM | Admit: 2019-09-30 | Discharge: 2019-09-30 | Disposition: A | Discharge status: Home / Self Care | Payer: 59 | Attending: Urgent Care | Admitting: Urgent Care

## 2019-09-30 ENCOUNTER — Other Ambulatory Visit: Payer: Self-pay

## 2019-09-30 ENCOUNTER — Encounter (HOSPITAL_COMMUNITY): Payer: Self-pay | Admitting: Emergency Medicine

## 2019-09-30 DIAGNOSIS — L03113 Cellulitis of right upper limb: Secondary | ICD-10-CM | POA: Diagnosis not present

## 2019-09-30 DIAGNOSIS — R591 Generalized enlarged lymph nodes: Secondary | ICD-10-CM

## 2019-09-30 DIAGNOSIS — I891 Lymphangitis: Secondary | ICD-10-CM

## 2019-09-30 MED ORDER — DOXYCYCLINE HYCLATE 100 MG PO CAPS: 100.0000 mg | ORAL_CAPSULE | Freq: Two times a day (BID) | ORAL | 0 refills | Status: DC

## 2019-09-30 MED ORDER — NAPROXEN 500 MG PO TABS: 500.0000 mg | ORAL_TABLET | Freq: Two times a day (BID) | ORAL | 0 refills | Status: DC

## 2019-09-30 NOTE — ED Triage Notes (Addendum)
Patient has knot under right arm, otherwise knot at elbow, and patient reports 2 more that he feels inner aspect of right upper arm.  Right elbow is red

## 2019-09-30 NOTE — ED Provider Notes (Signed)
[place]   MRN: [id number] DOB: 21-Aug-1991  Subjective:   Colton Hicks is a 28 y.o. male presenting for 1 week history of persistent worsening knots in his right arm now having redness and a knot in his right arm pit. Has not tried medications for relief. Patient is generally healthy.   No current facility-administered medications for this encounter.  Current Outpatient Medications:  .  loratadine (CLARITIN) 10 MG tablet, Take 10 mg by mouth daily., Disp: , Rfl:    No Known Allergies  History reviewed. No pertinent past medical history.   Past Surgical History:  Procedure Laterality Date  . APPENDECTOMY    . LAPAROSCOPIC APPENDECTOMY N/A 11/14/2016   Procedure: APPENDECTOMY LAPAROSCOPIC;  Surgeon: Clovis Riley, MD;  Location: [facility] OR;  Service: General;  Laterality: N/A;    Family History  Problem Relation Age of Onset  . Healthy Mother   . Healthy Father     Social History   Tobacco Use  . Smoking status: Current Every Day Smoker    Packs/day: 0.50    Types: Cigarettes  . Smokeless tobacco: Never Used  Substance Use Topics  . Alcohol use: Yes    Comment: occ  . Drug use: Yes    Types: Marijuana    Review of Systems  Constitutional: Negative for fever and malaise/fatigue.  HENT: Negative for congestion, ear pain, sinus pain and sore throat.   Eyes: Negative for discharge and redness.  Respiratory: Negative for cough, hemoptysis, shortness of breath and wheezing.   Cardiovascular: Negative for chest pain.  Gastrointestinal: Negative for abdominal pain, diarrhea, nausea and vomiting.  Genitourinary: Negative for dysuria, flank pain and hematuria.  Musculoskeletal: Positive for joint pain. Negative for myalgias.  Skin: Positive for rash.  Neurological: Negative for dizziness, weakness and headaches.  Psychiatric/Behavioral: Negative for depression and substance abuse.     Objective:   Vitals: BP 119/75 (BP Location: Left Arm)   Pulse 65    Temp 98.5 F (36.9 C) (Oral)   Resp 18   SpO2 99%   Physical Exam Constitutional:      General: He is not in acute distress.    Appearance: Normal appearance. He is well-developed. He is not ill-appearing, toxic-appearing or diaphoretic.  HENT:     Head: Normocephalic and atraumatic.     Right Ear: External ear normal.     Left Ear: External ear normal.     Nose: Nose normal.     Mouth/Throat:     Mouth: Mucous membranes are moist.     Pharynx: Oropharynx is clear.  Eyes:     General: No scleral icterus.    Extraocular Movements: Extraocular movements intact.     Pupils: Pupils are equal, round, and reactive to light.  Cardiovascular:     Rate and Rhythm: Normal rate and regular rhythm.     Heart sounds: Normal heart sounds. No murmur. No friction rub. No gallop.   Pulmonary:     Effort: Pulmonary effort is normal. No respiratory distress.     Breath sounds: Normal breath sounds. No stridor. No wheezing, rhonchi or rales.  Musculoskeletal:       Arms:  Lymphadenopathy:     Cervical: No cervical adenopathy.     Upper Body:     Right upper body: Axillary adenopathy present.  Neurological:     Mental Status: He is alert and oriented to person, place, and time.  Psychiatric:        Mood and Affect:  Mood normal.        Behavior: Behavior normal.        Thought Content: Thought content normal.           Assessment and Plan :   1. Cellulitis of right upper extremity   2. Lymphadenopathy   3. Lymphangitis     Will cover for infectious process with doxycycline. Use naproxen for lymphadenopathy. Recheck in 2-3 days. Establish care with PCP through [facility]. Counseled patient on potential for adverse effects with medications prescribed/recommended today, ER and return-to-clinic precautions discussed, patient verbalized understanding.    Wallis Bamberg, PA-C 09/30/19 1635

## 2020-01-26 ENCOUNTER — Encounter (HOSPITAL_COMMUNITY): Payer: Self-pay | Admitting: Emergency Medicine

## 2020-01-26 ENCOUNTER — Emergency Department (HOSPITAL_COMMUNITY)
Admission: EM | Admit: 2020-01-26 | Discharge: 2020-01-27 | Discharge status: Against Medical Advice | Payer: 59 | Attending: Emergency Medicine | Admitting: Emergency Medicine

## 2020-01-26 ENCOUNTER — Other Ambulatory Visit: Payer: Self-pay

## 2020-01-26 DIAGNOSIS — Z5321 Procedure and treatment not carried out due to patient leaving prior to being seen by health care provider: Secondary | ICD-10-CM | POA: Insufficient documentation

## 2020-01-26 DIAGNOSIS — R109 Unspecified abdominal pain: Secondary | ICD-10-CM | POA: Insufficient documentation

## 2020-01-26 LAB — URINALYSIS, ROUTINE W REFLEX MICROSCOPIC
Bilirubin Urine: NEGATIVE
Glucose, UA: NEGATIVE mg/dL
Hgb urine dipstick: NEGATIVE
Ketones, ur: NEGATIVE mg/dL
Leukocytes,Ua: NEGATIVE
Nitrite: NEGATIVE
Protein, ur: 30 mg/dL — AB
Specific Gravity, Urine: 1.023 (ref 1.005–1.030)
pH: 5 (ref 5.0–8.0)

## 2020-01-26 LAB — COMPREHENSIVE METABOLIC PANEL
ALT: 34 U/L (ref 0–44)
AST: 28 U/L (ref 15–41)
Albumin: 4.9 g/dL (ref 3.5–5.0)
Alkaline Phosphatase: 52 U/L (ref 38–126)
Anion gap: 12 (ref 5–15)
BUN: 10 mg/dL (ref 6–20)
CO2: 25 mmol/L (ref 22–32)
Calcium: 9.6 mg/dL (ref 8.9–10.3)
Chloride: 102 mmol/L (ref 98–111)
Creatinine, Ser: 1.22 mg/dL (ref 0.61–1.24)
GFR calc Af Amer: >60 mL/min (ref >60)
GFR calc non Af Amer: >60 mL/min (ref >60)
Glucose, Bld: 85 mg/dL (ref 70–99)
Potassium: 3.7 mmol/L (ref 3.5–5.1)
Sodium: 139 mmol/L (ref 135–145)
Total Bilirubin: 1.5 mg/dL — ABNORMAL HIGH (ref 0.3–1.2)
Total Protein: 7.7 g/dL (ref 6.5–8.1)

## 2020-01-26 LAB — LIPASE, BLOOD: Lipase: 33 U/L (ref 11–51)

## 2020-01-26 MED ORDER — SODIUM CHLORIDE 0.9% FLUSH: 3.0000 mL | Freq: Once | INTRAVENOUS | Status: DC

## 2020-01-26 NOTE — ED Triage Notes (Signed)
Pt brought to ED by [facility] from home for c/o abd pain nausea and vomiting started today at 4pm. 4 mg Zofran given by EMS pta. VS 132/80, HR 80,R-18, SPO2 98 % RA

## 2020-01-27 LAB — CBC
HCT: 54.4 % — ABNORMAL HIGH (ref 39.0–52.0)
Hemoglobin: 18.1 g/dL — ABNORMAL HIGH (ref 13.0–17.0)
MCH: 27.5 pg (ref 26.0–34.0)
MCHC: 33.3 g/dL (ref 30.0–36.0)
MCV: 82.8 fL (ref 80.0–100.0)
Platelets: 202 10*3/uL (ref 150–400)
RBC: 6.57 MIL/uL — ABNORMAL HIGH (ref 4.22–5.81)
RDW: 11.8 % (ref 11.5–15.5)
WBC: 12 10*3/uL — ABNORMAL HIGH (ref 4.0–10.5)
nRBC: 0 % (ref 0.0–0.2)

## 2020-01-27 NOTE — ED Notes (Signed)
Pt requested to leave. Pt asked for IV to be taken out. Pt was encouraged to stay, pt left.

## 2020-01-29 ENCOUNTER — Encounter (HOSPITAL_COMMUNITY): Payer: Self-pay

## 2020-01-29 ENCOUNTER — Ambulatory Visit (HOSPITAL_COMMUNITY)
Admission: EM | Admit: 2020-01-29 | Discharge: 2020-01-29 | Disposition: A | Discharge status: Home / Self Care | Payer: Self-pay | Attending: Physician Assistant | Admitting: Physician Assistant

## 2020-01-29 ENCOUNTER — Other Ambulatory Visit: Payer: Self-pay

## 2020-01-29 DIAGNOSIS — R1013 Epigastric pain: Secondary | ICD-10-CM | POA: Insufficient documentation

## 2020-01-29 DIAGNOSIS — R112 Nausea with vomiting, unspecified: Secondary | ICD-10-CM | POA: Insufficient documentation

## 2020-01-29 DIAGNOSIS — Z79899 Other long term (current) drug therapy: Secondary | ICD-10-CM | POA: Insufficient documentation

## 2020-01-29 DIAGNOSIS — Z20822 Contact with and (suspected) exposure to covid-19: Secondary | ICD-10-CM | POA: Insufficient documentation

## 2020-01-29 DIAGNOSIS — R197 Diarrhea, unspecified: Secondary | ICD-10-CM | POA: Insufficient documentation

## 2020-01-29 DIAGNOSIS — F17210 Nicotine dependence, cigarettes, uncomplicated: Secondary | ICD-10-CM | POA: Insufficient documentation

## 2020-01-29 DIAGNOSIS — R1084 Generalized abdominal pain: Secondary | ICD-10-CM | POA: Insufficient documentation

## 2020-01-29 LAB — COMPREHENSIVE METABOLIC PANEL
ALT: 26 U/L (ref 0–44)
AST: 24 U/L (ref 15–41)
Albumin: 4.6 g/dL (ref 3.5–5.0)
Alkaline Phosphatase: 54 U/L (ref 38–126)
Anion gap: 12 (ref 5–15)
BUN: 9 mg/dL (ref 6–20)
CO2: 24 mmol/L (ref 22–32)
Calcium: 9.4 mg/dL (ref 8.9–10.3)
Chloride: 102 mmol/L (ref 98–111)
Creatinine, Ser: 1.04 mg/dL (ref 0.61–1.24)
GFR calc Af Amer: >60 mL/min (ref >60)
GFR calc non Af Amer: >60 mL/min (ref >60)
Glucose, Bld: 94 mg/dL (ref 70–99)
Potassium: 4.1 mmol/L (ref 3.5–5.1)
Sodium: 138 mmol/L (ref 135–145)
Total Bilirubin: 0.8 mg/dL (ref 0.3–1.2)
Total Protein: 7.3 g/dL (ref 6.5–8.1)

## 2020-01-29 LAB — CBC
HCT: 50.3 % (ref 39.0–52.0)
Hemoglobin: 17.3 g/dL — ABNORMAL HIGH (ref 13.0–17.0)
MCH: 28.5 pg (ref 26.0–34.0)
MCHC: 34.4 g/dL (ref 30.0–36.0)
MCV: 82.9 fL (ref 80.0–100.0)
Platelets: 202 10*3/uL (ref 150–400)
RBC: 6.07 MIL/uL — ABNORMAL HIGH (ref 4.22–5.81)
RDW: 11.6 % (ref 11.5–15.5)
WBC: 5.9 10*3/uL (ref 4.0–10.5)
nRBC: 0 % (ref 0.0–0.2)

## 2020-01-29 MED ORDER — ONDANSETRON HCL 4 MG PO TABS: 4.0000 mg | ORAL_TABLET | Freq: Four times a day (QID) | ORAL | 0 refills | Status: DC

## 2020-01-29 MED ORDER — FAMOTIDINE 20 MG PO TABS: 20.0000 mg | ORAL_TABLET | Freq: Two times a day (BID) | ORAL | 0 refills | Status: DC

## 2020-01-29 NOTE — Discharge Instructions (Addendum)
Take the zofran for nausea  Take the pepcid 2 times a day  Increase your liquid intake with water and pedialyte  I have sent some labs, if they are significantly abnormal I will notify you, otherwise they will be in your Mychart  If worsening abdominal pain, vomiting, or fever develop please go to the Emergency Department

## 2020-01-29 NOTE — ED Provider Notes (Signed)
[facility]    CSN: [id number] Arrival date & time: 01/29/20  1647      History   Chief Complaint Chief Complaint  Patient presents with  . Abdominal Pain    HPI Colton Hicks is a 29 y.o. male.   Patient reports urgent care for evaluation of abdominal pain and diarrhea.  He reports symptoms all started 3 days ago on Friday.  Reports at the time of symptoms starting had severe upper abdominal pain with nausea, vomiting and diarrhea.  He reports the vomiting has since stopped.  He reports 1 episode of loose stool per day.  He reports this is watery and brown.  With regard to his abdominal pain he continues to endorse upper abdominal pain though less severe.  Rated at a 6-7 out of 10.  He denies significant odor. denies blood or dark color.  He has not had fever but he has had body ache and fatigue.  Denies headache, cough, sore throat, congestion. No blood in vomit.   He reports he has been able to keep some foods down however fried foods make the abdominal pain worse.  He does report that he was able to keep down grilled chicken and grilled hotdogs.  He does report that he eats a lot of fast food as he works in the [employer]. Has tolerated water and other liquids.  Patient went to the emergency department Friday evening however left without being seen.  He did have basic lab work done.  Significant for high white blood cell count and a slightly elevated bilirubin.  Lipase was normal.     History reviewed. No pertinent past medical history.  Patient Active Problem List   Diagnosis Date Noted  . Acute appendicitis 11/14/2016    Past Surgical History:  Procedure Laterality Date  . APPENDECTOMY    . LAPAROSCOPIC APPENDECTOMY N/A 11/14/2016   Procedure: APPENDECTOMY LAPAROSCOPIC;  Surgeon: Berna Bue, MD;  Location: [facility];  Service: General;  Laterality: N/A;       Home Medications    Prior to Admission medications   Medication Sig Start Date  End Date Taking? Authorizing Provider  doxycycline (VIBRAMYCIN) 100 MG capsule Take 1 capsule (100 mg total) by mouth 2 (two) times daily. 09/30/19   Wallis Bamberg, PA-C  famotidine (PEPCID) 20 MG tablet Take 1 tablet (20 mg total) by mouth 2 (two) times daily. 01/29/20   Reilynn Lauro, Veryl Speak, PA-C  loratadine (CLARITIN) 10 MG tablet Take 10 mg by mouth daily.    [provider]  naproxen (NAPROSYN) 500 MG tablet Take 1 tablet (500 mg total) by mouth 2 (two) times daily. 09/30/19   Wallis Bamberg, PA-C  ondansetron (ZOFRAN) 4 MG tablet Take 1 tablet (4 mg total) by mouth every 6 (six) hours. 01/29/20   Tonyia Marschall, Veryl Speak, PA-C    Family History Family History  Problem Relation Age of Onset  . Healthy Mother   . Healthy Father     Social History Social History   Tobacco Use  . Smoking status: Current Every Day Smoker    Packs/day: 0.50    Types: Cigarettes  . Smokeless tobacco: Never Used  Substance Use Topics  . Alcohol use: Yes    Comment: occ  . Drug use: Yes    Types: Marijuana     Allergies   Patient has no known allergies.   Review of Systems Review of Systems   Physical Exam Triage Vital Signs ED Triage Vitals  Enc Vitals Group     BP 01/29/20 1758 124/86     Pulse Rate 01/29/20 1758 62     Resp 01/29/20 1758 16     Temp 01/29/20 1758 98.1 F (36.7 C)     Temp Source 01/29/20 1758 Oral     SpO2 01/29/20 1758 97 %     Weight 01/29/20 1759 150 lb (68 kg)     Height 01/29/20 1759 5\' 9"  (1.753 m)     Head Circumference --      Peak Flow --      Pain Score 01/29/20 1758 7     Pain Loc --      Pain Edu? --      Excl. in [place]? --    No data found.  Updated Vital Signs BP 124/86   Pulse 62   Temp 98.1 F (36.7 C) (Oral)   Resp 16   Ht 5\' 9"  (1.753 m)   Wt 150 lb (68 kg)   SpO2 97%   BMI 22.15 kg/m   Visual Acuity Right Eye Distance:   Left Eye Distance:   Bilateral Distance:    Right Eye Near:   Left Eye Near:    Bilateral Near:     Physical  Exam Vitals and nursing note reviewed.  Constitutional:      General: He is not in acute distress.    Appearance: He is well-developed. He is ill-appearing.  HENT:     Head: Normocephalic and atraumatic.     Mouth/Throat:     Mouth: Mucous membranes are moist.     Pharynx: Oropharynx is clear.  Eyes:     Extraocular Movements: Extraocular movements intact.     Conjunctiva/sclera: Conjunctivae normal.     Pupils: Pupils are equal, round, and reactive to light.  Cardiovascular:     Rate and Rhythm: Normal rate and regular rhythm.     Heart sounds: No murmur.  Pulmonary:     Effort: Pulmonary effort is normal. No respiratory distress.     Breath sounds: Normal breath sounds. No wheezing or rales.  Abdominal:     General: Bowel sounds are increased.     Palpations: Abdomen is soft. There is no hepatomegaly or splenomegaly.     Tenderness: There is generalized abdominal tenderness. There is no right CVA tenderness, left CVA tenderness or rebound. Negative signs include Murphy's sign and McBurney's sign.     Hernia: No hernia is present.  Musculoskeletal:     Cervical back: Neck supple.  Skin:    General: Skin is warm and dry.  Neurological:     Mental Status: He is alert.      UC Treatments / Results  Labs (all labs ordered are listed, but only abnormal results are displayed) Labs Reviewed  CBC - Abnormal; Notable for the following components:      Result Value   RBC 6.07 (*)    Hemoglobin 17.3 (*)    All other components within normal limits  SARS CORONAVIRUS 2 (TAT 6-24 HRS)  COMPREHENSIVE METABOLIC PANEL   Recent Results (from the past 2160 hour(s))  Lipase, blood     Status: None   Collection Time: 01/26/20 10:36 PM  Result Value Ref Range   Lipase 33 11 - 51 U/L    Comment: Performed at [facility] [address], [place], [place] [address]  Comprehensive metabolic panel     Status: Abnormal   Collection Time: 01/26/20 10:36 PM  Result Value  Ref Range    Sodium 139 135 - 145 mmol/L   Potassium 3.7 3.5 - 5.1 mmol/L   Chloride 102 98 - 111 mmol/L   CO2 25 22 - 32 mmol/L   Glucose, Bld 85 70 - 99 mg/dL    Comment: Glucose reference range applies only to samples taken after fasting for at least 8 hours.   BUN 10 6 - 20 mg/dL   Creatinine, Ser 2.59 0.61 - 1.24 mg/dL   Calcium 9.6 8.9 - 56.3 mg/dL   Total Protein 7.7 6.5 - 8.1 g/dL   Albumin 4.9 3.5 - 5.0 g/dL   AST 28 15 - 41 U/L   ALT 34 0 - 44 U/L   Alkaline Phosphatase 52 38 - 126 U/L   Total Bilirubin 1.5 (H) 0.3 - 1.2 mg/dL   GFR calc non Af Amer >60 >60 mL/min   GFR calc Af Amer >60 >60 mL/min   Anion gap 12 5 - 15    Comment: Performed at [facility], [address], [place], [place] [address]  CBC     Status: Abnormal   Collection Time: 01/26/20 10:36 PM  Result Value Ref Range   WBC 12.0 (H) 4.0 - 10.5 K/uL   RBC 6.57 (H) 4.22 - 5.81 MIL/uL   Hemoglobin 18.1 (H) 13.0 - 17.0 g/dL   HCT 33.2 (H) 95.1 - 88.4 %   MCV 82.8 80.0 - 100.0 fL   MCH 27.5 26.0 - 34.0 pg   MCHC 33.3 30.0 - 36.0 g/dL   RDW 16.6 06.3 - 01.6 %   Platelets 202 150 - 400 K/uL    Comment: REPEATED TO VERIFY   nRBC 0.0 0.0 - 0.2 %    Comment: Performed at [facility], [address] [place], [place] [address]  Urinalysis, Routine w reflex microscopic     Status: Abnormal   Collection Time: 01/26/20 10:36 PM  Result Value Ref Range   Color, Urine YELLOW YELLOW   APPearance CLEAR CLEAR   Specific Gravity, Urine 1.023 1.005 - 1.030   pH 5.0 5.0 - 8.0   Glucose, UA NEGATIVE NEGATIVE mg/dL   Hgb urine dipstick NEGATIVE NEGATIVE   Bilirubin Urine NEGATIVE NEGATIVE   Ketones, ur NEGATIVE NEGATIVE mg/dL   Protein, ur 30 (A) NEGATIVE mg/dL   Nitrite NEGATIVE NEGATIVE   Leukocytes,Ua NEGATIVE NEGATIVE   RBC / HPF 0-5 0 - 5 RBC/hpf   WBC, UA 0-5 0 - 5 WBC/hpf   Bacteria, UA RARE (A) NONE SEEN   Mucus PRESENT    Hyaline Casts, UA PRESENT     Comment: Performed at [facility], [address] [place], [place] [address]  CBC     Status: Abnormal   Collection Time: 01/29/20  6:26 PM  Result Value Ref Range   WBC 5.9 4.0 - 10.5 K/uL   RBC 6.07 (H) 4.22 - 5.81 MIL/uL   Hemoglobin 17.3 (H) 13.0 - 17.0 g/dL   HCT 32.2 02.5 - 42.7 %   MCV 82.9 80.0 - 100.0 fL   MCH 28.5 26.0 - 34.0 pg   MCHC 34.4 30.0 - 36.0 g/dL   RDW 06.2 37.6 - 28.3 %   Platelets 202 150 - 400 K/uL   nRBC 0.0 0.0 - 0.2 %    Comment: Performed at [facility], [address], [place], [place] [address]  Comprehensive metabolic panel     Status: None   Collection Time: 01/29/20  6:26 PM  Result Value Ref Range   Sodium 138 135 - 145 mmol/L   Potassium 4.1 3.5 - 5.1 mmol/L   Chloride 102 98 - 111 mmol/L   CO2 24 22 - 32 mmol/L   Glucose, Bld 94 70 - 99 mg/dL    Comment: Glucose reference range applies only to samples taken after fasting for at least 8 hours.   BUN 9 6 - 20 mg/dL   Creatinine, Ser 4.31 0.61 - 1.24 mg/dL   Calcium 9.4 8.9 - 54.0 mg/dL   Total Protein 7.3 6.5 - 8.1 g/dL   Albumin 4.6 3.5 - 5.0 g/dL   AST 24 15 - 41 U/L   ALT 26 0 - 44 U/L   Alkaline Phosphatase 54 38 - 126 U/L   Total Bilirubin 0.8 0.3 - 1.2 mg/dL   GFR calc non Af Amer >60 >60 mL/min   GFR calc Af Amer >60 >60 mL/min   Anion gap 12 5 - 15    Comment: Performed at [facility], [address], [place], [place] [address]    EKG   Radiology No results found.  Procedures Procedures (including critical care time)  Medications Ordered in UC Medications - No data to display  Initial Impression / Assessment and Plan / UC Course  I have reviewed the triage vital signs and the nursing notes.  Pertinent labs & imaging results that were available during my care of the patient were reviewed by me and considered in my medical decision making (see chart for details). Labs from 01/26/2020 emergency department visit were reviewed.    #Generalized abdominal pain #Dyspepsia Patient is a 29 year old  presenting with abdominal pain dyspepsia symptoms.  Labs from 01/26/2020 showed dehydration pattern.  All labs repeated have improved with white count normalizing, bilirubin normalizing creatinine down from 1.22-1.04.  Given some clinical improvement and laboratory improvement do believe patient is generally improving.  Likely this was viral illness, Covid would be on the differential, Covid PCR was sent.  Discussed symptomatic management with Zofran for nausea and Pepcid for gastric irritation and to increase liquid intake patient.  Discussed strict emergency department precautions for worsening abdominal pain, fever or inability tolerate liquids orally.  Patient verbalized understanding plan.   Final Clinical Impressions(s) / UC Diagnoses   Final diagnoses:  Generalized abdominal pain  Dyspepsia     Discharge Instructions     Take the zofran for nausea  Take the pepcid 2 times a day  Increase your liquid intake with water and pedialyte  I have sent some labs, if they are significantly abnormal I will notify you, otherwise they will be in your Mychart  If worsening abdominal pain, vomiting, or fever develop please go to the Emergency Department      ED Prescriptions    Medication Sig Dispense Auth. Provider   ondansetron (ZOFRAN) 4 MG tablet Take 1 tablet (4 mg total) by mouth every 6 (six) hours. 8 tablet Avir Deruiter, Veryl Speak, PA-C   famotidine (PEPCID) 20 MG tablet Take 1 tablet (20 mg total) by mouth 2 (two) times daily. 30 tablet Briyah Wheelwright, Veryl Speak, PA-C     PDMP not reviewed this encounter.   Hermelinda Medicus, PA-C 01/29/20 2234

## 2020-01-29 NOTE — ED Triage Notes (Signed)
Pt c/o 7/10 sharp mid upper abdominal pain, N/V/D, fatiguex3 days. Pt states he hasn't vomited for 2 days, but still had diarrhea and abdominal pain.

## 2020-01-30 LAB — SARS CORONAVIRUS 2 (TAT 6-24 HRS): SARS Coronavirus 2: NEGATIVE

## 2020-09-26 ENCOUNTER — Ambulatory Visit
Admission: EM | Admit: 2020-09-26 | Discharge: 2020-09-26 | Disposition: A | Discharge status: Home / Self Care | Payer: Self-pay | Attending: Emergency Medicine | Admitting: Emergency Medicine

## 2020-09-26 ENCOUNTER — Other Ambulatory Visit: Payer: Self-pay

## 2020-09-26 DIAGNOSIS — N342 Other urethritis: Secondary | ICD-10-CM | POA: Insufficient documentation

## 2020-09-26 LAB — POCT URINALYSIS DIP (MANUAL ENTRY)
Bilirubin, UA: NEGATIVE
Blood, UA: NEGATIVE
Glucose, UA: NEGATIVE mg/dL
Ketones, POC UA: NEGATIVE mg/dL
Leukocytes, UA: NEGATIVE
Nitrite, UA: NEGATIVE
Protein Ur, POC: NEGATIVE mg/dL
Spec Grav, UA: 1.025 (ref 1.010–1.025)
Urobilinogen, UA: 0.2 E.U./dL
pH, UA: 5.5 (ref 5.0–8.0)

## 2020-09-26 MED ORDER — DOXYCYCLINE HYCLATE 100 MG PO CAPS: 100.0000 mg | ORAL_CAPSULE | Freq: Two times a day (BID) | ORAL | 0 refills | Status: AC

## 2020-09-26 MED ORDER — CEFTRIAXONE SODIUM 500 MG IJ SOLR
500.0000 mg | Freq: Once | INTRAMUSCULAR | Status: AC
  Administered 2020-09-26: 16:00:00 500 mg via INTRAMUSCULAR

## 2020-09-26 NOTE — Discharge Instructions (Signed)
We have treated you today for gonorrhea, with rocephin.  Continue doxycycline twice daily for 1 week, please pick up from pharmacy. please refrain from sexual activity for 7 days while medicine is clearing infection.  We are testing you for Gonorrhea, Chlamydia and Trichomonas. We will call you if anything is positive and let you know if you require any further treatment. Please inform partner of any positive results.  Please return if symptoms not improving with treatment, development of fever, nausea, vomiting, abdominal pain, scrotal pain.

## 2020-09-26 NOTE — ED Provider Notes (Signed)
[facility]    CSN: [id number] Arrival date & time: 09/26/20  1356      History   Chief Complaint Chief Complaint  Patient presents with   Penile Discharge    X 3 days    HPI Colton Hicks is a 29 y.o. male presenting today for evaluation of penile itching.  Patient reports that he has had sensation of itching within his urethra.  Also felt sensation of possible penile discharge, but has not seen any.  Does report new partner recently.  Denies any testicle pain or swelling.  Denies any rashes or lesions  HPI  History reviewed. No pertinent past medical history.  Patient Active Problem List   Diagnosis Date Noted   Acute appendicitis 11/14/2016    Past Surgical History:  Procedure Laterality Date   APPENDECTOMY     LAPAROSCOPIC APPENDECTOMY N/A 11/14/2016   Procedure: APPENDECTOMY LAPAROSCOPIC;  Surgeon: Berna Bue, MD;  Location: [facility];  Service: General;  Laterality: N/A;       Home Medications    Prior to Admission medications   Medication Sig Start Date End Date Taking? Authorizing Provider  doxycycline (VIBRAMYCIN) 100 MG capsule Take 1 capsule (100 mg total) by mouth 2 (two) times daily for 7 days. 09/26/20 10/03/20  Davionne Mastrangelo C, PA-C  loratadine (CLARITIN) 10 MG tablet Take 10 mg by mouth daily.    [provider]  famotidine (PEPCID) 20 MG tablet Take 1 tablet (20 mg total) by mouth 2 (two) times daily. 01/29/20 09/26/20  Darr, Gerilyn Pilgrim, PA-C    Family History Family History  Problem Relation Age of Onset   Healthy Mother    Healthy Father     Social History Social History   Tobacco Use   Smoking status: Current Every Day Smoker    Packs/day: 0.50    Types: Cigarettes   Smokeless tobacco: Never Used  Vaping Use   Vaping Use: Never used  Substance Use Topics   Alcohol use: Yes    Comment: occ   Drug use: Yes    Types: Marijuana     Allergies   Patient has no known allergies.   Review of  Systems Review of Systems  Constitutional: Negative for fever.  HENT: Negative for sore throat.   Respiratory: Negative for shortness of breath.   Cardiovascular: Negative for chest pain.  Gastrointestinal: Negative for abdominal pain, nausea and vomiting.  Genitourinary: Positive for penile discharge. Negative for difficulty urinating, dysuria, frequency, penile pain, penile swelling, scrotal swelling and testicular pain.  Skin: Negative for rash.  Neurological: Negative for dizziness, light-headedness and headaches.     Physical Exam Triage Vital Signs ED Triage Vitals  Enc Vitals Group     BP 09/26/20 1425 134/85     Pulse Rate 09/26/20 1425 72     Resp 09/26/20 1425 17     Temp 09/26/20 1425 98 F (36.7 C)     Temp Source 09/26/20 1425 Oral     SpO2 09/26/20 1425 99 %     Weight --      Height --      Head Circumference --      Peak Flow --      Pain Score 09/26/20 1427 0     Pain Loc --      Pain Edu? --      Excl. in [place]? --    No data found.  Updated Vital Signs BP 134/85 (BP Location: Left Arm)  Pulse 72    Temp 98 F (36.7 C) (Oral)    Resp 17    SpO2 99%   Visual Acuity Right Eye Distance:   Left Eye Distance:   Bilateral Distance:    Right Eye Near:   Left Eye Near:    Bilateral Near:     Physical Exam Vitals and nursing note reviewed.  Constitutional:      Appearance: He is well-developed and well-nourished.     Comments: No acute distress  HENT:     Head: Normocephalic and atraumatic.     Nose: Nose normal.  Eyes:     Conjunctiva/sclera: Conjunctivae normal.  Cardiovascular:     Rate and Rhythm: Normal rate.  Pulmonary:     Effort: Pulmonary effort is normal. No respiratory distress.  Abdominal:     General: There is no distension.  Genitourinary:    Comments: Circumcised, no rashes or lesions noted externally, small amount of moisture and discharge noted in meatus Musculoskeletal:        General: Normal range of motion.     Cervical  back: Neck supple.  Skin:    General: Skin is warm and dry.  Neurological:     Mental Status: He is alert and oriented to person, place, and time.  Psychiatric:        Mood and Affect: Mood and affect normal.      UC Treatments / Results  Labs (all labs ordered are listed, but only abnormal results are displayed) Labs Reviewed  POCT URINALYSIS DIP (MANUAL ENTRY)  CYTOLOGY, (ORAL, ANAL, URETHRAL) ANCILLARY ONLY    EKG   Radiology No results found.  Procedures Procedures (including critical care time)  Medications Ordered in UC Medications  cefTRIAXone (ROCEPHIN) injection 500 mg (500 mg Intramuscular Given 09/26/20 1535)    Initial Impression / Assessment and Plan / UC Course  I have reviewed the triage vital signs and the nursing notes.  Pertinent labs & imaging results that were available during my care of the patient were reviewed by me and considered in my medical decision making (see chart for details).     UA unremarkable, urethral swab pending for STD screening.  Empirically treating for gonorrhea and chlamydia today with Rocephin and doxycycline.  Will call with results and provide further treatment as needed.  Discussed strict return precautions. Patient verbalized understanding and is agreeable with plan.  Final Clinical Impressions(s) / UC Diagnoses   Final diagnoses:  Urethritis     Discharge Instructions     We have treated you today for gonorrhea, with rocephin.  Continue doxycycline twice daily for 1 week, please pick up from pharmacy. please refrain from sexual activity for 7 days while medicine is clearing infection.  We are testing you for Gonorrhea, Chlamydia and Trichomonas. We will call you if anything is positive and let you know if you require any further treatment. Please inform partner of any positive results.  Please return if symptoms not improving with treatment, development of fever, nausea, vomiting, abdominal pain, scrotal  pain.    ED Prescriptions    Medication Sig Dispense Auth. Provider   doxycycline (VIBRAMYCIN) 100 MG capsule Take 1 capsule (100 mg total) by mouth 2 (two) times daily for 7 days. 14 capsule Ayshia Gramlich, Bicknell C, PA-C     PDMP not reviewed this encounter.   Lew Dawes, PA-C 09/26/20 1539

## 2020-09-26 NOTE — ED Triage Notes (Signed)
Patient states that he has had an itching feeling inside his penis x 3 days and has also had the feeling of a discharge but has not seen one. Pt is aox4 and ambulatory.

## 2020-09-30 ENCOUNTER — Ambulatory Visit: Payer: Self-pay

## 2020-09-30 LAB — CYTOLOGY, (ORAL, ANAL, URETHRAL) ANCILLARY ONLY
Chlamydia: NEGATIVE
Comment: NEGATIVE
Comment: NEGATIVE
Comment: NORMAL
Neisseria Gonorrhea: NEGATIVE
Trichomonas: NEGATIVE

## 2020-10-13 ENCOUNTER — Other Ambulatory Visit: Payer: Self-pay

## 2020-10-13 ENCOUNTER — Encounter (HOSPITAL_COMMUNITY): Payer: Self-pay

## 2020-10-13 ENCOUNTER — Ambulatory Visit (HOSPITAL_COMMUNITY)
Admission: EM | Admit: 2020-10-13 | Discharge: 2020-10-13 | Disposition: A | Discharge status: Home / Self Care | Payer: Self-pay | Attending: Family Medicine | Admitting: Family Medicine

## 2020-10-13 DIAGNOSIS — R109 Unspecified abdominal pain: Secondary | ICD-10-CM

## 2020-10-13 DIAGNOSIS — M549 Dorsalgia, unspecified: Secondary | ICD-10-CM

## 2020-10-13 DIAGNOSIS — R300 Dysuria: Secondary | ICD-10-CM

## 2020-10-13 LAB — POCT URINALYSIS DIPSTICK, ED / UC
Bilirubin Urine: NEGATIVE
Glucose, UA: NEGATIVE mg/dL
Hgb urine dipstick: NEGATIVE
Ketones, ur: NEGATIVE mg/dL
Leukocytes,Ua: NEGATIVE
Nitrite: NEGATIVE
Protein, ur: NEGATIVE mg/dL
Specific Gravity, Urine: 1.025 (ref 1.005–1.030)
Urobilinogen, UA: 0.2 mg/dL (ref 0.0–1.0)
pH: 5.5 (ref 5.0–8.0)

## 2020-10-13 MED ORDER — LIDOCAINE HCL (PF) 1 % IJ SOLN
INTRAMUSCULAR | Status: AC
  Filled 2020-10-13: qty 2

## 2020-10-13 MED ORDER — KETOROLAC TROMETHAMINE 60 MG/2ML IM SOLN
INTRAMUSCULAR | Status: AC
  Filled 2020-10-13: qty 2

## 2020-10-13 MED ORDER — KETOROLAC TROMETHAMINE 60 MG/2ML IM SOLN
60.0000 mg | Freq: Once | INTRAMUSCULAR | Status: AC
  Administered 2020-10-13: 60 mg via INTRAMUSCULAR

## 2020-10-13 MED ORDER — CEFTRIAXONE SODIUM 500 MG IJ SOLR
INTRAMUSCULAR | Status: AC
  Filled 2020-10-13: qty 500

## 2020-10-13 MED ORDER — CEFTRIAXONE SODIUM 500 MG IJ SOLR
500.0000 mg | Freq: Once | INTRAMUSCULAR | Status: AC
  Administered 2020-10-13: 500 mg via INTRAMUSCULAR

## 2020-10-13 NOTE — ED Provider Notes (Signed)
[facility]    CSN: [id number] Arrival date & time: 10/13/20  1511      History   Chief Complaint Chief Complaint  Patient presents with  . Back Pain    Burning urination x 2 weeks    HPI Colton Hicks is a 30 y.o. male.   Here today with over 2 weeks of b/l flank and lower abdominal pain and dysuria. Also has had intermittent penile discharge. Was seen about 2 weeks ago, STI testing at that time all negative and treated with rocephin IM and doxycycline which he completed without benefit. Denies rashes, fever, chills, N/V/D, hematuria, penile pain, scrotal swelling or redness. No new sexual partners.      History reviewed. No pertinent past medical history.  Patient Active Problem List   Diagnosis Date Noted  . Acute appendicitis 11/14/2016    Past Surgical History:  Procedure Laterality Date  . APPENDECTOMY    . LAPAROSCOPIC APPENDECTOMY N/A 11/14/2016   Procedure: APPENDECTOMY LAPAROSCOPIC;  Surgeon: Berna Bue, MD;  Location: [facility];  Service: General;  Laterality: N/A;       Home Medications    Prior to Admission medications   Medication Sig Start Date End Date Taking? Authorizing Provider  loratadine (CLARITIN) 10 MG tablet Take 10 mg by mouth daily.    [provider]  famotidine (PEPCID) 20 MG tablet Take 1 tablet (20 mg total) by mouth 2 (two) times daily. 01/29/20 09/26/20  Darr, Gerilyn Pilgrim, PA-C    Family History Family History  Problem Relation Age of Onset  . Healthy Mother   . Healthy Father     Social History Social History   Tobacco Use  . Smoking status: Current Every Day Smoker    Packs/day: 0.50    Types: Cigarettes  . Smokeless tobacco: Never Used  Vaping Use  . Vaping Use: Never used  Substance Use Topics  . Alcohol use: Yes    Comment: occ  . Drug use: Yes    Types: Marijuana     Allergies   Patient has no known allergies.   Review of Systems Review of Systems PER HPI   Physical Exam Triage  Vital Signs ED Triage Vitals  Enc Vitals Group     BP 10/13/20 1624 118/82     Pulse Rate 10/13/20 1624 67     Resp --      Temp 10/13/20 1624 98 F (36.7 C)     Temp Source 10/13/20 1624 Oral     SpO2 10/13/20 1624 99 %     Weight --      Height --      Head Circumference --      Peak Flow --      Pain Score 10/13/20 1626 8     Pain Loc --      Pain Edu? --      Excl. in [place]? --    No data found.  Updated Vital Signs BP 118/82 (BP Location: Right Arm)   Pulse 67   Temp 98 F (36.7 C) (Oral)   SpO2 99%   Visual Acuity Right Eye Distance:   Left Eye Distance:   Bilateral Distance:    Right Eye Near:   Left Eye Near:    Bilateral Near:     Physical Exam Vitals and nursing note reviewed.  Constitutional:      Appearance: Normal appearance.  HENT:     Head: Atraumatic.     Mouth/Throat:  Mouth: Mucous membranes are moist.     Pharynx: Oropharynx is clear.  Eyes:     Extraocular Movements: Extraocular movements intact.     Conjunctiva/sclera: Conjunctivae normal.  Cardiovascular:     Rate and Rhythm: Normal rate and regular rhythm.  Pulmonary:     Effort: Pulmonary effort is normal.     Breath sounds: Normal breath sounds.  Abdominal:     General: Bowel sounds are normal. There is no distension.     Palpations: Abdomen is soft.     Tenderness: There is abdominal tenderness (mild b/l lower abdominal ttp). There is no right CVA tenderness, left CVA tenderness or guarding.     Comments: Minimal b/l flank ttp   Genitourinary:    Comments: GU exam deferred, self swab performed Musculoskeletal:        General: Normal range of motion.     Cervical back: Normal range of motion and neck supple.  Skin:    General: Skin is warm and dry.  Neurological:     General: No focal deficit present.     Mental Status: He is oriented to person, place, and time.  Psychiatric:        Mood and Affect: Mood normal.        Thought Content: Thought content normal.         Judgment: Judgment normal.      UC Treatments / Results  Labs (all labs ordered are listed, but only abnormal results are displayed) Labs Reviewed  POCT URINALYSIS DIPSTICK, ED / UC  CYTOLOGY, (ORAL, ANAL, URETHRAL) ANCILLARY ONLY    EKG   Radiology No results found.  Procedures Procedures (including critical care time)  Medications Ordered in UC Medications  cefTRIAXone (ROCEPHIN) injection 500 mg (500 mg Intramuscular Given 10/13/20 1821)  ketorolac (TORADOL) injection 60 mg (60 mg Intramuscular Given 10/13/20 1821)    Initial Impression / Assessment and Plan / UC Course  I have reviewed the triage vital signs and the nursing notes.  Pertinent labs & imaging results that were available during my care of the patient were reviewed by me and considered in my medical decision making (see chart for details).     U/A benign both today and 2 weeks ago, cytology for STIs neg 2 weeks ago and did not respond to empiric tx. Will give IM toradol and IM rocephin today and recheck cyto swab, Urology information given for close f/u given ongoing issue without obvious cause.   Final Clinical Impressions(s) / UC Diagnoses   Final diagnoses:  Dysuria  Flank pain   Discharge Instructions   None    ED Prescriptions    None     PDMP not reviewed this encounter.   Particia Nearing, [place] 10/13/20 Paulo Fruit

## 2020-10-13 NOTE — ED Triage Notes (Signed)
Patient states he has had worsening lower back and abdominal pain x 2 weeks as well as painful urination. Pt is aox4 ambulatory.

## 2020-10-14 LAB — CYTOLOGY, (ORAL, ANAL, URETHRAL) ANCILLARY ONLY
Chlamydia: NEGATIVE
Comment: NEGATIVE
Comment: NEGATIVE
Comment: NORMAL
Neisseria Gonorrhea: NEGATIVE
Trichomonas: NEGATIVE

## 2021-03-03 ENCOUNTER — Telehealth: Payer: Self-pay | Admitting: Family

## 2021-03-03 DIAGNOSIS — U071 COVID-19: Secondary | ICD-10-CM

## 2021-03-03 MED ORDER — ALBUTEROL SULFATE HFA 108 (90 BASE) MCG/ACT IN AERS: 2.0000 | INHALATION_SPRAY | Freq: Four times a day (QID) | RESPIRATORY_TRACT | 0 refills | Status: DC | PRN

## 2021-03-03 MED ORDER — DEXAMETHASONE 6 MG PO TABS
6.0000 mg | ORAL_TABLET | Freq: Two times a day (BID) | ORAL | 0 refills | Status: DC
Start: 2021-03-03 — End: 2022-02-13

## 2021-03-03 MED ORDER — BENZONATATE 100 MG PO CAPS: 100.0000 mg | ORAL_CAPSULE | Freq: Three times a day (TID) | ORAL | 0 refills | Status: DC | PRN

## 2021-03-03 NOTE — Progress Notes (Signed)
E-Visit for Positive Covid Test Result We are sorry you are not feeling well. We are here to help!  You have tested positive for COVID-19, meaning that you were infected with the novel coronavirus and could give the virus to others.  It is vitally important that you stay home so you do not spread it to others.      Please continue isolation at home, for at least 10 days since the start of your symptoms and until you have had 24 hours with no fever (without taking a fever reducer) and with improving of symptoms.  If you have no symptoms but tested positive (or all symptoms resolve after 5 days and you have no fever) you can leave your house but continue to wear a mask around others for an additional 5 days. If you have a fever,continue to stay home until you have had 24 hours of no fever. Most cases improve 5-10 days from onset but we have seen a small number of patients who have gotten worse after the 10 days.  Please be sure to watch for worsening symptoms and remain taking the proper precautions.   Go to the nearest hospital ED for assessment if fever/cough/breathlessness are severe or illness seems like a threat to life.    The following symptoms may appear 2-14 days after exposure: . Fever . Cough . Shortness of breath or difficulty breathing . Chills . Repeated shaking with chills . Muscle pain . Headache . Sore throat . New loss of taste or smell . Fatigue . Congestion or runny nose . Nausea or vomiting . Diarrhea  You have been enrolled in MyChart Home Monitoring for COVID-19. Daily you will receive a questionnaire within the MyChart website. Our COVID-19 response team will be monitoring your responses daily.  You can use medication such as prescription cough medication called Tessalon Perles 100 mg. You may take 1-2 capsules every 8 hours as needed for cough and  prescription inhaler called Albuterol MDI 90 mcg /actuation 2 puffs every 4 hours as needed for shortness of breath,  wheezing, cough and dexamethasone 6 mg twice a day for 7 days.   You may also take acetaminophen (Tylenol) as needed for fever.  HOME CARE: . Only take medications as instructed by your medical team. . Drink plenty of fluids and get plenty of rest. . A steam or ultrasonic humidifier can help if you have congestion.   GET HELP RIGHT AWAY IF YOU HAVE EMERGENCY WARNING SIGNS.  Call 911 or proceed to your closest emergency facility if: . You develop worsening high fever. . Trouble breathing . Bluish lips or face . Persistent pain or pressure in the chest . New confusion . Inability to wake or stay awake . You cough up blood. . Your symptoms become more severe . Inability to hold down food or fluids  This list is not all possible symptoms. Contact your medical provider for any symptoms that are severe or concerning to you.    Your e-visit answers were reviewed by a board certified advanced clinical practitioner to complete your personal care plan.  Depending on the condition, your plan could have included both over the counter or prescription medications.  If there is a problem please reply once you have received a response from your provider.  Your safety is important to us.  If you have drug allergies check your prescription carefully.    You can use MyChart to ask questions about today's visit, request a non-urgent call back,   or ask for a work or school excuse for 24 hours related to this e-Visit. If it has been greater than 24 hours you will need to follow up with your provider, or enter a new e-Visit to address those concerns. You will get an e-mail in the next two days asking about your experience.  I hope that your e-visit has been valuable and will speed your recovery. Thank you for using e-visits.    Approximately 5 minutes was spent documenting and reviewing patient's chart.

## 2021-10-08 ENCOUNTER — Emergency Department (HOSPITAL_COMMUNITY)
Admission: EM | Admit: 2021-10-08 | Discharge: 2021-10-08 | Discharge status: Against Medical Advice | Payer: Self-pay | Source: Home / Self Care

## 2022-02-06 ENCOUNTER — Ambulatory Visit: Payer: Self-pay | Admitting: Nurse Practitioner

## 2022-02-13 ENCOUNTER — Encounter: Payer: Self-pay | Admitting: Nurse Practitioner

## 2022-02-13 ENCOUNTER — Ambulatory Visit (INDEPENDENT_AMBULATORY_CARE_PROVIDER_SITE_OTHER): Payer: 59 | Admitting: Nurse Practitioner

## 2022-02-13 VITALS — BP 122/80 | HR 84 | Temp 97.5°F | Resp 16 | Ht 68.0 in | Wt 155.1 lb

## 2022-02-13 DIAGNOSIS — M62838 Other muscle spasm: Secondary | ICD-10-CM | POA: Diagnosis not present

## 2022-02-13 DIAGNOSIS — B372 Candidiasis of skin and nail: Secondary | ICD-10-CM

## 2022-02-13 DIAGNOSIS — Z1159 Encounter for screening for other viral diseases: Secondary | ICD-10-CM

## 2022-02-13 DIAGNOSIS — Z131 Encounter for screening for diabetes mellitus: Secondary | ICD-10-CM | POA: Diagnosis not present

## 2022-02-13 DIAGNOSIS — Z7689 Persons encountering health services in other specified circumstances: Secondary | ICD-10-CM

## 2022-02-13 DIAGNOSIS — Z13220 Encounter for screening for lipoid disorders: Secondary | ICD-10-CM | POA: Diagnosis not present

## 2022-02-13 DIAGNOSIS — Z130 Encounter for screening for diseases of the blood and blood-forming organs and certain disorders involving the immune mechanism: Secondary | ICD-10-CM

## 2022-02-13 DIAGNOSIS — Z114 Encounter for screening for human immunodeficiency virus [HIV]: Secondary | ICD-10-CM

## 2022-02-13 MED ORDER — HYDROXYZINE PAMOATE 25 MG PO CAPS
25.0000 mg | ORAL_CAPSULE | Freq: Every evening | ORAL | 0 refills | Status: DC | PRN
Start: 2022-02-13 — End: 2024-07-04

## 2022-02-13 MED ORDER — METHOCARBAMOL 500 MG PO TABS
500.0000 mg | ORAL_TABLET | Freq: Two times a day (BID) | ORAL | 0 refills | Status: DC | PRN
Start: 2022-02-13 — End: 2023-07-17

## 2022-02-13 MED ORDER — NYSTATIN-TRIAMCINOLONE 100000-0.1 UNIT/GM-% EX OINT: 1.0000 "application " | TOPICAL_OINTMENT | Freq: Two times a day (BID) | CUTANEOUS | 0 refills | Status: DC

## 2022-02-13 NOTE — Progress Notes (Signed)
? ?BP 122/80   Pulse 84   Temp (!) 97.5 ?F (36.4 ?C)   Resp 16   Ht 5\' 8"  (1.727 m)   Wt 155 lb 1.6 oz (70.4 kg)   SpO2 94%   BMI 23.58 kg/m?   ? ?Subjective:  ? ? Patient ID: Colton Hicks, male    DOB: Jan 16, 1991, 31 y.o.   MRN: [id number] ? ?HPI: ?Delvecchio Colton Hicks is a 31 y.o. male ? ?Chief Complaint  ?Patient presents with  ? Rash  ?  Between legs  ? ?Establish care: He says his last physical was many years ago. No medical problems and no medications.  ? ?Rash between legs: He says he has noticed a rash between his thighs for about a week.  He says he tried to use some Goldbond to help with it.  He says it is very itchy.  No new soaps.  On inspection of the rash it is red and wet appears to be a yeast rash.  We will send in nystatin ointment.   Discussed keeping area clean and dry.  Use Goldbond to help keep moisture away.  She reports it is very itchy at night, will send in prescription for hydroxyzine to help with itching. ? ?Neck and upper back pain:  He says he has a very physical job and he says that he has been very stiff recently. He says that it has happened in the past where he was having muscle spasms.  He does have some muscle tightness in the upper trapezius muscle.  Discussed heat therapy, massage, may use ibuprofen pain, sent a prescription for muscle relaxer. ? ?Relevant past medical, surgical, family and social history reviewed and updated as indicated. Interim medical history since our last visit reviewed. ?Allergies and medications reviewed and updated. ? ?Review of Systems ? ?Constitutional: Negative for fever or weight change.  ?Respiratory: Negative for cough and shortness of breath.   ?Cardiovascular: Negative for chest pain or palpitations.  ?Gastrointestinal: Negative for abdominal pain, no bowel changes.  ?Musculoskeletal: Negative for gait problem or joint swelling.  Positive for upper back pain. ?Skin: Positive for rash.  ?Neurological: Negative for dizziness or headache.   ?No other specific complaints in a complete review of systems (except as listed in HPI above).  ? ?   ?Objective:  ?  ?BP 122/80   Pulse 84   Temp (!) 97.5 ?F (36.4 ?C)   Resp 16   Ht 5\' 8"  (1.727 m)   Wt 155 lb 1.6 oz (70.4 kg)   SpO2 94%   BMI 23.58 kg/m?   ?Wt Readings from Last 3 Encounters:  ?02/13/22 155 lb 1.6 oz (70.4 kg)  ?01/29/20 150 lb (68 kg)  ?01/26/20 150 lb (68 kg)  ?  ?Physical Exam ? ?Constitutional: Patient appears well-developed and well-nourished. No distress.  ?HEENT: head atraumatic, normocephalic, pupils equal and reactive to light, neck supple ?Cardiovascular: Normal rate, regular rhythm and normal heart sounds.  No murmur heard. No BLE edema. ?Pulmonary/Chest: Effort normal and breath sounds normal. No respiratory distress. ?Abdominal: Soft.  There is no tenderness. ?MSK: tightness in left upper trapezius muscle ?Skin: red, moist rash between thighs  ?Psychiatric: Patient has a normal mood and affect. behavior is normal. Judgment and thought content normal.  ? ?Results for orders placed or performed during the hospital encounter of 10/13/20  ?POC Urinalysis dipstick  ?Result Value Ref Range  ? Glucose, UA NEGATIVE NEGATIVE mg/dL  ? Bilirubin Urine NEGATIVE NEGATIVE  ?  Ketones, ur NEGATIVE NEGATIVE mg/dL  ? Specific Gravity, Urine 1.025 1.005 - 1.030  ? Hgb urine dipstick NEGATIVE NEGATIVE  ? pH 5.5 5.0 - 8.0  ? Protein, ur NEGATIVE NEGATIVE mg/dL  ? Urobilinogen, UA 0.2 0.0 - 1.0 mg/dL  ? Nitrite NEGATIVE NEGATIVE  ? Leukocytes,Ua NEGATIVE NEGATIVE  ?Cytology (oral, anal, urethral) ancillary only  ?Result Value Ref Range  ? Trichomonas Negative   ? Chlamydia Negative   ? Neisseria Gonorrhea Negative   ? Comment Normal Reference Ranger Chlamydia - Negative   ? Comment Normal Reference Range Trichomonas - Negative   ? Comment    ?  Normal Reference Range Neisseria Gonorrhea - Negative  ? ?   ?Assessment & Plan:  ? ?Problem List Items Addressed This Visit   ?None ?Visit Diagnoses   ? ?  Yeast infection of the skin    -  Primary  ? Patient has red, moist, and itchy rash between thighs.  Appears to be a yeast rash.  Apply nystatin ointment 2 times a day and hydroxyzine for at night for itch  ? Relevant Medications  ? nystatin-triamcinolone ointment (MYCOLOG)  ? hydrOXYzine (VISTARIL) 25 MG capsule  ? Trapezius muscle spasm      ? Left upper trapezius muscle tightness and spasm, use heat therapy, ibuprofen and we will send in muscle relaxer to use as needed.  ? Relevant Medications  ? methocarbamol (ROBAXIN) 500 MG tablet  ? Screening for diabetes mellitus      ? Relevant Orders  ? COMPLETE METABOLIC PANEL WITH GFR  ? Screening for cholesterol level      ? Relevant Orders  ? Lipid panel  ? Screening for deficiency anemia      ? Relevant Orders  ? CBC with Differential/Platelet  ? Encounter for hepatitis C screening test for low risk patient      ? Relevant Orders  ? Hepatitis C antibody  ? Encounter for screening for HIV      ? Relevant Orders  ? HIV Antibody (routine testing w rflx)  ? Encounter to establish care      ? Schedule for CPE  ? ?  ?  ? ?Follow up plan: ?Return for cpe. ? ? ? ? ? ?

## 2022-02-14 ENCOUNTER — Encounter: Payer: Self-pay | Admitting: Nurse Practitioner

## 2022-02-15 LAB — LIPID PANEL
Cholesterol: 178 mg/dL (ref <200)
HDL: 37 mg/dL — ABNORMAL LOW (ref >=40)
LDL Cholesterol (Calc): 121 mg/dL (calc) — ABNORMAL HIGH
Non-HDL Cholesterol (Calc): 141 mg/dL (calc) — ABNORMAL HIGH (ref <130)
Total CHOL/HDL Ratio: 4.8 (calc) (ref <5.0)
Triglycerides: 101 mg/dL (ref <150)

## 2022-02-15 LAB — COMPLETE METABOLIC PANEL WITH GFR
AG Ratio: 2 (calc) (ref 1.0–2.5)
ALT: 28 U/L (ref 9–46)
AST: 23 U/L (ref 10–40)
Albumin: 4.6 g/dL (ref 3.6–5.1)
Alkaline phosphatase (APISO): 50 U/L (ref 36–130)
BUN: 11 mg/dL (ref 7–25)
CO2: 26 mmol/L (ref 20–32)
Calcium: 9.4 mg/dL (ref 8.6–10.3)
Chloride: 105 mmol/L (ref 98–110)
Creat: 1.11 mg/dL (ref 0.60–1.26)
Globulin: 2.3 g/dL (calc) (ref 1.9–3.7)
Glucose, Bld: 91 mg/dL (ref 65–99)
Potassium: 4 mmol/L (ref 3.5–5.3)
Sodium: 139 mmol/L (ref 135–146)
Total Bilirubin: 0.6 mg/dL (ref 0.2–1.2)
Total Protein: 6.9 g/dL (ref 6.1–8.1)
eGFR: 91 mL/min/{1.73_m2} (ref >=60)

## 2022-02-15 LAB — HEPATITIS C ANTIBODY
Hepatitis C Ab: NONREACTIVE
SIGNAL TO CUT-OFF: 0.2 (ref <1.00)

## 2022-02-15 LAB — CBC WITH DIFFERENTIAL/PLATELET
Absolute Monocytes: 482 cells/uL (ref 200–950)
Basophils Absolute: 59 cells/uL (ref 0–200)
Basophils Relative: 0.9 %
Eosinophils Absolute: 238 cells/uL (ref 15–500)
Eosinophils Relative: 3.6 %
HCT: 48.4 % (ref 38.5–50.0)
Hemoglobin: 16.2 g/dL (ref 13.2–17.1)
Lymphs Abs: 1439 cells/uL (ref 850–3900)
MCH: 28.4 pg (ref 27.0–33.0)
MCHC: 33.5 g/dL (ref 32.0–36.0)
MCV: 84.8 fL (ref 80.0–100.0)
MPV: 12.6 fL — ABNORMAL HIGH (ref 7.5–12.5)
Monocytes Relative: 7.3 %
Neutro Abs: 4382 cells/uL (ref 1500–7800)
Neutrophils Relative %: 66.4 %
Platelets: 203 10*3/uL (ref 140–400)
RBC: 5.71 10*6/uL (ref 4.20–5.80)
RDW: 12.1 % (ref 11.0–15.0)
Total Lymphocyte: 21.8 %
WBC: 6.6 10*3/uL (ref 3.8–10.8)

## 2022-02-15 LAB — HIV ANTIBODY (ROUTINE TESTING W REFLEX): HIV 1&2 Ab, 4th Generation: NONREACTIVE

## 2022-03-12 NOTE — Progress Notes (Deleted)
Name: Colton Hicks   MRN: [id number]    DOB: May 13, 1991   Date:03/12/2022       Progress Note  Subjective  Chief Complaint  No chief complaint on file.   HPI  Patient presents for annual CPE .   Diet: *** Exercise: *** Sleep:***  Depression: phq 9 is {gen pos EYE:233612}    02/13/2022    3:47 PM  Depression screen PHQ 2/9  Decreased Interest 0  Down, Depressed, Hopeless 0  PHQ - 2 Score 0  Altered sleeping 0  Tired, decreased energy 0  Change in appetite 0  Feeling bad or failure about yourself  0  Trouble concentrating 0  Moving slowly or fidgety/restless 0  Suicidal thoughts 0  PHQ-9 Score 0  Difficult doing work/chores Not difficult at all    Hypertension:  BP Readings from Last 3 Encounters:  02/13/22 122/80  10/13/20 118/82  09/26/20 134/85    Obesity: Wt Readings from Last 3 Encounters:  02/13/22 155 lb 1.6 oz (70.4 kg)  01/29/20 150 lb (68 kg)  01/26/20 150 lb (68 kg)   BMI Readings from Last 3 Encounters:  02/13/22 23.58 kg/m  01/29/20 22.15 kg/m  01/26/20 22.15 kg/m     Lipids:  Lab Results  Component Value Date   CHOL 178 02/13/2022   Lab Results  Component Value Date   HDL 37 (L) 02/13/2022   Lab Results  Component Value Date   LDLCALC 121 (H) 02/13/2022   Lab Results  Component Value Date   TRIG 101 02/13/2022   Lab Results  Component Value Date   CHOLHDL 4.8 02/13/2022   No results found for: LDLDIRECT Glucose:  Glucose, Bld  Date Value Ref Range Status  02/13/2022 91 65 - 99 mg/dL Final    Comment:    .            Fasting reference interval .   01/29/2020 94 70 - 99 mg/dL Final    Comment:    Glucose reference range applies only to samples taken after fasting for at least 8 hours.  01/26/2020 85 70 - 99 mg/dL Final    Comment:    Glucose reference range applies only to samples taken after fasting for at least 8 hours.      Significant Other STD testing and prevention (HIV/chl/gon/syphilis):  02/13/2022 Hep C: 02/13/2022  Skin cancer: Discussed monitoring for atypical lesions Colorectal cancer: no concerns, does not qualify Prostate cancer: no concerns, does not qualify No results found for: PSA   Lung cancer:   Low Dose CT Chest recommended if Age 44-80 years, 30 pack-year currently smoking OR have quit w/in 15years. Patient does not qualify.   AAA:  The USPSTF recommends one-time screening with ultrasonography in men ages 37 to 55 years who have ever smoked ECG:  none  Vaccines:  HPV: up to at age 79 , ask insurance if age between 75-45  Shingrix: 62-64 yo and ask insurance if covered when patient above 79 yo Pneumonia:  educated and discussed with patient. Flu:  educated and discussed with patient.  Advanced Care Planning: A voluntary discussion about advance care planning including the explanation and discussion of advance directives.  Discussed health care proxy and Living will, and the patient was able to identify a health care proxy as ***.  Patient {DOES_DOES AES:97530} have a living will at present time. If patient does have living will, I have requested they bring this to the clinic to be scanned in to  their chart.  There are no problems to display for this patient.   Past Surgical History:  Procedure Laterality Date   APPENDECTOMY     LAPAROSCOPIC APPENDECTOMY N/A 11/14/2016   Procedure: APPENDECTOMY LAPAROSCOPIC;  Surgeon: Clovis Riley, MD;  Location: [facility];  Service: General;  Laterality: N/A;    Family History  Problem Relation Age of Onset   Anxiety disorder Mother    Hyperlipidemia Mother    Healthy Mother    Healthy Father     Social History   Socioeconomic History   Marital status: Significant Other    Spouse name: Not on file   Number of children: Not on file   Years of education: Not on file   Highest education level: Not on file  Occupational History   Not on file  Tobacco Use   Smoking status: Every Day    Packs/day: 0.50    Types:  Cigarettes   Smokeless tobacco: Never  Vaping Use   Vaping Use: Never used  Substance and Sexual Activity   Alcohol use: Yes    Comment: occ   Drug use: Yes    Types: Marijuana   Sexual activity: Yes  Other Topics Concern   Not on file  Social History Narrative   ** Merged History Encounter **       Social Determinants of Health   Financial Resource Strain: Not on file  Food Insecurity: Not on file  Transportation Needs: Not on file  Physical Activity: Not on file  Stress: Not on file  Social Connections: Not on file  Intimate Partner Violence: Not on file     Current Outpatient Medications:    hydrOXYzine (VISTARIL) 25 MG capsule, Take 1 capsule (25 mg total) by mouth at bedtime as needed., Disp: 30 capsule, Rfl: 0   methocarbamol (ROBAXIN) 500 MG tablet, Take 1 tablet (500 mg total) by mouth 2 (two) times daily as needed for muscle spasms., Disp: 20 tablet, Rfl: 0   nystatin-triamcinolone ointment (MYCOLOG), Apply 1 application. topically 2 (two) times daily., Disp: 30 g, Rfl: 0  No Known Allergies   ROS  ***   Objective  There were no vitals filed for this visit.  There is no height or weight on file to calculate BMI.  Physical Exam ***  Recent Results (from the past 2160 hour(s))  CBC with Differential/Platelet     Status: Abnormal   Collection Time: 02/13/22  4:06 PM  Result Value Ref Range   WBC 6.6 3.8 - 10.8 Thousand/uL   RBC 5.71 4.20 - 5.80 Million/uL   Hemoglobin 16.2 13.2 - 17.1 g/dL   HCT 48.4 38.5 - 50.0 %   MCV 84.8 80.0 - 100.0 fL   MCH 28.4 27.0 - 33.0 pg   MCHC 33.5 32.0 - 36.0 g/dL   RDW 12.1 11.0 - 15.0 %   Platelets 203 140 - 400 Thousand/uL   MPV 12.6 (H) 7.5 - 12.5 fL   Neutro Abs 4,382 1,500 - 7,800 cells/uL   Lymphs Abs 1,439 850 - 3,900 cells/uL   Absolute Monocytes 482 200 - 950 cells/uL   Eosinophils Absolute 238 15 - 500 cells/uL   Basophils Absolute 59 0 - 200 cells/uL   Neutrophils Relative % 66.4 %   Total  Lymphocyte 21.8 %   Monocytes Relative 7.3 %   Eosinophils Relative 3.6 %   Basophils Relative 0.9 %  COMPLETE METABOLIC PANEL WITH GFR     Status: None   Collection Time: 02/13/22  4:06 PM  Result Value Ref Range   Glucose, Bld 91 65 - 99 mg/dL    Comment: .            Fasting reference interval .    BUN 11 7 - 25 mg/dL   Creat 1.11 0.60 - 1.26 mg/dL   eGFR 91 > OR = 60 mL/min/1.51m    Comment: The eGFR is based on the CKD-EPI 2021 equation. To calculate  the new eGFR from a previous Creatinine or Cystatin C result, go to https://www.kidney.org/professionals/ kdoqi/gfr%5Fcalculator    BUN/Creatinine Ratio NOT APPLICABLE 6 - 22 (calc)   Sodium 139 135 - 146 mmol/L   Potassium 4.0 3.5 - 5.3 mmol/L   Chloride 105 98 - 110 mmol/L   CO2 26 20 - 32 mmol/L   Calcium 9.4 8.6 - 10.3 mg/dL   Total Protein 6.9 6.1 - 8.1 g/dL   Albumin 4.6 3.6 - 5.1 g/dL   Globulin 2.3 1.9 - 3.7 g/dL (calc)   AG Ratio 2.0 1.0 - 2.5 (calc)   Total Bilirubin 0.6 0.2 - 1.2 mg/dL   Alkaline phosphatase (APISO) 50 36 - 130 U/L   AST 23 10 - 40 U/L   ALT 28 9 - 46 U/L  Lipid panel     Status: Abnormal   Collection Time: 02/13/22  4:06 PM  Result Value Ref Range   Cholesterol 178 <200 mg/dL   HDL 37 (L) > OR = 40 mg/dL   Triglycerides 101 <150 mg/dL   LDL Cholesterol (Calc) 121 (H) mg/dL (calc)    Comment: Reference range: <100 . Desirable range <100 mg/dL for primary prevention;   <70 mg/dL for patients with CHD or diabetic patients  with > or = 2 CHD risk factors. .Marland KitchenLDL-C is now calculated using the Martin-Hopkins  calculation, which is a validated novel method providing  better accuracy than the Friedewald equation in the  estimation of LDL-C.  MCresenciano Genreet al. JAnnamaria Helling [id number];128(78: [phone]  (http://education.[facility].com/faq/FAQ164)    Total CHOL/HDL Ratio 4.8 <5.0 (calc)   Non-HDL Cholesterol (Calc) 141 (H) <130 mg/dL (calc)    Comment: For patients with diabetes plus 1 major ASCVD  risk  factor, treating to a non-HDL-C goal of <100 mg/dL  (LDL-C of <70 mg/dL) is considered a therapeutic  option.   Hepatitis C antibody     Status: None   Collection Time: 02/13/22  4:06 PM  Result Value Ref Range   Hepatitis C Ab NON-REACTIVE NON-REACTIVE   SIGNAL TO CUT-OFF 0.20 <1.00    Comment: . HCV antibody was non-reactive. There is no laboratory  evidence of HCV infection. . In most cases, no further action is required. However, if recent HCV exposure is suspected, a test for HCV RNA (test code [id number] is suggested. . For additional information please refer to http://education.[facility].com/faq/FAQ22v1 (This link is being provided for informational/ educational purposes only.) .   HIV Antibody (routine testing w rflx)     Status: None   Collection Time: 02/13/22  4:06 PM  Result Value Ref Range   HIV 1&2 Ab, 4th Generation NON-REACTIVE NON-REACTIVE    Comment: HIV-1 antigen and HIV-1/HIV-2 antibodies were not detected. There is no laboratory evidence of HIV infection. .Marland KitchenPLEASE NOTE: This information has been disclosed to you from records whose confidentiality may be protected by state law.  If your state requires such protection, then the state law prohibits you from making any further disclosure of the information without the specific written consent of  the person to whom it pertains, or as otherwise permitted by law. A general authorization for the release of medical or other information is NOT sufficient for this purpose. . For additional information please refer to http://education.[facility].com/faq/FAQ106 (This link is being provided for informational/ educational purposes only.) . Marland Kitchen The performance of this assay has not been clinically validated in patients less than 46 years old. .      Fall Risk:    02/13/2022    3:47 PM  [place] in the past year? 0  Number falls in past yr: 0  Injury with Fall? 0    ***   Functional Status Survey:   ***   Assessment & Plan  There are no diagnoses linked to this encounter.   -Prostate cancer screening and PSA options (with potential risks and benefits of testing vs not testing) were discussed along with recent recs/guidelines. -USPSTF grade A and B recommendations reviewed with patient; age-appropriate recommendations, preventive care, screening tests, etc discussed and encouraged; healthy living encouraged; see AVS for patient education given to patient -Discussed importance of 150 minutes of physical activity weekly, eat two servings of fish weekly, eat one serving of tree nuts ( cashews, pistachios, pecans, almonds.Marland Kitchen) every other day, eat 6 servings of fruit/vegetables daily and drink plenty of water and avoid sweet beverages.

## 2022-03-13 ENCOUNTER — Encounter: Payer: 59 | Admitting: Nurse Practitioner

## 2023-01-26 ENCOUNTER — Encounter: Payer: Self-pay | Admitting: *Deleted

## 2023-01-26 ENCOUNTER — Emergency Department
Admission: EM | Admit: 2023-01-26 | Discharge: 2023-01-26 | Disposition: A | Discharge status: Home / Self Care | Payer: Self-pay | Attending: Emergency Medicine | Admitting: Emergency Medicine

## 2023-01-26 ENCOUNTER — Other Ambulatory Visit: Payer: Self-pay

## 2023-01-26 DIAGNOSIS — F410 Panic disorder [episodic paroxysmal anxiety] without agoraphobia: Secondary | ICD-10-CM | POA: Insufficient documentation

## 2023-01-26 DIAGNOSIS — E876 Hypokalemia: Secondary | ICD-10-CM | POA: Insufficient documentation

## 2023-01-26 DIAGNOSIS — F10920 Alcohol use, unspecified with intoxication, uncomplicated: Secondary | ICD-10-CM | POA: Insufficient documentation

## 2023-01-26 DIAGNOSIS — E860 Dehydration: Secondary | ICD-10-CM | POA: Insufficient documentation

## 2023-01-26 DIAGNOSIS — R112 Nausea with vomiting, unspecified: Secondary | ICD-10-CM | POA: Insufficient documentation

## 2023-01-26 LAB — URINE DRUG SCREEN, QUALITATIVE (ARMC ONLY)
Amphetamines, Ur Screen: NOT DETECTED
Barbiturates, Ur Screen: NOT DETECTED
Benzodiazepine, Ur Scrn: NOT DETECTED
Cannabinoid 50 Ng, Ur ~~LOC~~: POSITIVE — AB
Cocaine Metabolite,Ur ~~LOC~~: NOT DETECTED
MDMA (Ecstasy)Ur Screen: NOT DETECTED
Methadone Scn, Ur: NOT DETECTED
Opiate, Ur Screen: NOT DETECTED
Phencyclidine (PCP) Ur S: NOT DETECTED
Tricyclic, Ur Screen: NOT DETECTED

## 2023-01-26 LAB — CBC
HCT: 48.2 % (ref 39.0–52.0)
Hemoglobin: 16.7 g/dL (ref 13.0–17.0)
MCH: 28.9 pg (ref 26.0–34.0)
MCHC: 34.6 g/dL (ref 30.0–36.0)
MCV: 83.4 fL (ref 80.0–100.0)
Platelets: 212 10*3/uL (ref 150–400)
RBC: 5.78 MIL/uL (ref 4.22–5.81)
RDW: 11.9 % (ref 11.5–15.5)
WBC: 12.1 10*3/uL — ABNORMAL HIGH (ref 4.0–10.5)
nRBC: 0 % (ref 0.0–0.2)

## 2023-01-26 LAB — COMPREHENSIVE METABOLIC PANEL
ALT: 54 U/L — ABNORMAL HIGH (ref 0–44)
AST: 42 U/L — ABNORMAL HIGH (ref 15–41)
Albumin: 4.6 g/dL (ref 3.5–5.0)
Alkaline Phosphatase: 55 U/L (ref 38–126)
Anion gap: 12 (ref 5–15)
BUN: 11 mg/dL (ref 6–20)
CO2: 21 mmol/L — ABNORMAL LOW (ref 22–32)
Calcium: 8.8 mg/dL — ABNORMAL LOW (ref 8.9–10.3)
Chloride: 104 mmol/L (ref 98–111)
Creatinine, Ser: 1.21 mg/dL (ref 0.61–1.24)
GFR, Estimated: >60 mL/min (ref >60)
Glucose, Bld: 104 mg/dL — ABNORMAL HIGH (ref 70–99)
Potassium: 2.9 mmol/L — ABNORMAL LOW (ref 3.5–5.1)
Sodium: 137 mmol/L (ref 135–145)
Total Bilirubin: 0.6 mg/dL (ref 0.3–1.2)
Total Protein: 7.6 g/dL (ref 6.5–8.1)

## 2023-01-26 LAB — ETHANOL: Alcohol, Ethyl (B): 205 mg/dL — ABNORMAL HIGH (ref <10)

## 2023-01-26 MED ORDER — ONDANSETRON 4 MG PO TBDP
4.0000 mg | ORAL_TABLET | Freq: Three times a day (TID) | ORAL | 0 refills | Status: DC | PRN
Start: 2023-01-26 — End: 2024-07-04

## 2023-01-26 MED ORDER — POTASSIUM CHLORIDE CRYS ER 20 MEQ PO TBCR: 40.0000 meq | EXTENDED_RELEASE_TABLET | Freq: Once | ORAL | Status: DC

## 2023-01-26 MED ORDER — ONDANSETRON HCL 4 MG/2ML IJ SOLN
4.0000 mg | Freq: Once | INTRAMUSCULAR | Status: AC
  Administered 2023-01-26: 4 mg via INTRAVENOUS
  Filled 2023-01-26: qty 2

## 2023-01-26 MED ORDER — POTASSIUM CHLORIDE CRYS ER 10 MEQ PO TBCR: 10.0000 meq | EXTENDED_RELEASE_TABLET | Freq: Two times a day (BID) | ORAL | 0 refills | Status: DC

## 2023-01-26 MED ORDER — SODIUM CHLORIDE 0.9 % IV BOLUS
1000.0000 mL | Freq: Once | INTRAVENOUS | Status: AC
  Administered 2023-01-26: 1000 mL via INTRAVENOUS

## 2023-01-26 NOTE — ED Notes (Signed)
Mom here to pick up pt. Pt awake, alert, oriented, ambulatory.

## 2023-01-26 NOTE — ED Triage Notes (Signed)
Pt brought in via ems from home.  Pt reportedly drank a lot of beer and smoked weed tonight.  Pt has dry heaves and states he had a panic attack.  Pt has stiffness of his arms.  Pt sleepy.  Pt in hallway bed.

## 2023-01-26 NOTE — ED Notes (Signed)
Pt sleeping. 

## 2023-01-26 NOTE — ED Provider Notes (Signed)
[facility] Provider Note    Event Date/Time   First MD Initiated Contact with Patient 01/26/23 [phone]     (approximate)  History   Chief Complaint: Panic Attack  HPI  Colton Hicks is a 32 y.o. male with no significant past medical history who presents to the emergency department for a reported "panic attack."  According to the patient he admits to drinking a significant amount of alcohol today and then smoked marijuana.  States he had a "panic attack" developed nausea with multiple episodes of vomiting and came to the emergency department for evaluation.  Upon arrival however patient has become more somnolent.  Did briefly desat while sleeping to 88% placed on 2 L nasal cannula.  Patient will awaken to loud voice or mild physical stimuli.  Falls back asleep if not engaged.  Physical Exam   Triage Vital Signs: ED Triage Vitals  Enc Vitals Group     BP 01/26/23 0232 125/67     Pulse Rate 01/26/23 0232 72     Resp 01/26/23 0232 (!) 24     Temp 01/26/23 0232 (!) 97.5 F (36.4 C)     Temp Source 01/26/23 0232 Axillary     SpO2 01/26/23 0232 100 %     Weight 01/26/23 0233 154 lb 5.2 oz (70 kg)     Height 01/26/23 0233  (1.727 m)     Head Circumference --      Peak Flow --      Pain Score --      Pain Loc --      Pain Edu? --      Excl. in GC? --     Most recent vital signs: Vitals:   01/26/23 0232  BP: 125/67  Pulse: 72  Resp: (!) 24  Temp: (!) 97.5 F (36.4 C)  SpO2: 100%    General: Awake, no distress.  CV:  Good peripheral perfusion.  Regular rate and rhythm  Resp:  Normal effort.  Equal breath sounds bilaterally.  Abd:  No distention.  Soft, nontender.  No rebound or guarding.   ED Results / Procedures / Treatments   MEDICATIONS ORDERED IN ED: Medications  sodium chloride 0.9 % bolus 1,000 mL (has no administration in time range)     IMPRESSION / MDM / ASSESSMENT AND PLAN / ED COURSE  I reviewed the triage vital signs  and the nursing notes.  Patient's presentation is most consistent with acute presentation with potential threat to life or bodily function.  Patient presents to the emergency department for nausea vomiting possible "panic attack."  Patient admits to significant alcohol use as well as marijuana use.  We will check labs including CBC chemistry and ethanol level.  Will IV hydrate.  While the patient is sleeping heavily he does desat to around 88% placed on 2 L nasal cannula satting 100% currently.  We will dose Zofran for nausea.  Will continue to closely monitor in the emergency department.  Patient is now awake alert, no distress.  He has ambulated to the restroom.  Setting 100% on room air.  Patient's urine drug screen is cannabinoid positive only.  Patient CBC shows slight leukocytosis of 12,000 otherwise reassuring chemistry shows mild hypokalemia no other significant findings besides mild LFT elevation although the patient is a frequent/heavy drinker.  Patient's ethanol level is 205.  Patient's sister is now here with the patient.  He otherwise appears well no distress.  States he is feeling better  he thinks it could have just been his anxiety and dehydration.  Patient has received IV fluids and is also drinking water  FINAL CLINICAL IMPRESSION(S) / ED DIAGNOSES   Alcohol intoxication Nausea vomiting Dehydration  Note:  This document was prepared using Dragon voice recognition software and may include unintentional dictation errors.   Minna Antis, MD 01/26/23 [phone]

## 2023-01-26 NOTE — ED Notes (Signed)
Md  with pt in hallway bed.

## 2023-07-17 ENCOUNTER — Emergency Department: Payer: Medicaid Other

## 2023-07-17 ENCOUNTER — Emergency Department
Admission: EM | Admit: 2023-07-17 | Discharge: 2023-07-17 | Disposition: A | Discharge status: Home / Self Care | Payer: Medicaid Other | Attending: Emergency Medicine | Admitting: Emergency Medicine

## 2023-07-17 ENCOUNTER — Other Ambulatory Visit: Payer: Self-pay

## 2023-07-17 DIAGNOSIS — M25512 Pain in left shoulder: Secondary | ICD-10-CM | POA: Diagnosis present

## 2023-07-17 MED ORDER — KETOROLAC TROMETHAMINE 15 MG/ML IJ SOLN
15.0000 mg | Freq: Once | INTRAMUSCULAR | Status: AC
  Administered 2023-07-17: 15 mg via INTRAMUSCULAR
  Filled 2023-07-17: qty 1

## 2023-07-17 MED ORDER — PREDNISONE 10 MG (21) PO TBPK: ORAL_TABLET | ORAL | 0 refills | Status: DC

## 2023-07-17 MED ORDER — MELOXICAM 15 MG PO TABS: 15.0000 mg | ORAL_TABLET | Freq: Every day | ORAL | 0 refills | Status: AC

## 2023-07-17 MED ORDER — DEXAMETHASONE SODIUM PHOSPHATE 10 MG/ML IJ SOLN
10.0000 mg | Freq: Once | INTRAMUSCULAR | Status: AC
Start: 2023-07-17 — End: 2023-07-17
  Administered 2023-07-17: 10 mg via INTRAMUSCULAR
  Filled 2023-07-17: qty 1

## 2023-07-17 MED ORDER — CYCLOBENZAPRINE HCL 10 MG PO TABS: 10.0000 mg | ORAL_TABLET | Freq: Three times a day (TID) | ORAL | 0 refills | Status: DC | PRN

## 2023-07-17 MED ORDER — MELOXICAM 15 MG PO TABS: 15.0000 mg | ORAL_TABLET | Freq: Every day | ORAL | 0 refills | Status: DC

## 2023-07-17 NOTE — Discharge Instructions (Addendum)
You can take the cyclobenzaprine 3 times daily as needed for muscle spasms.  Keep in mind that this medication can be sedating so do not drive after taking it.  Take the meloxicam once a day for 14 days.  Do not take any other NSAIDs like ibuprofen or naproxen while taking this medication.  Please follow-up with Dr. Allena Katz who is an orthopedic provider if your symptoms do not improve.  Schedule an appointment with his office.  You could also try going to [facility] urgent care whose information is listed below:  [facility] [address] [place], [place] [address] Open every day 9am-9pm

## 2023-07-17 NOTE — ED Provider Notes (Signed)
[facility] Provider Note    Event Date/Time   First MD Initiated Contact with Patient 07/17/23 1648     (approximate)   History   Back Pain and Shoulder Pain   HPI  Colton Hicks is a 32 y.o. male  with no PMH presents valuation of back pain and left shoulder pain for 1 week.  Patient has taken ibuprofen, muscle relaxers and Tylenol at home without any significant relief.  He denies any specific injuries.  No numbness or tingling down the left arm.      Physical Exam   Triage Vital Signs: ED Triage Vitals  Encounter Vitals Group     BP 07/17/23 1611 (!) 135/97     Systolic BP Percentile --      Diastolic BP Percentile --      Pulse Rate 07/17/23 1611 60     Resp 07/17/23 1611 20     Temp 07/17/23 1611 99.1 F (37.3 C)     Temp Source 07/17/23 1611 Oral     SpO2 07/17/23 1611 96 %     Weight 07/17/23 1609 145 lb (65.8 kg)     Height 07/17/23 1609 5\' 9"  (1.753 m)     Head Circumference --      Peak Flow --      Pain Score 07/17/23 1609 10     Pain Loc --      Pain Education --      Exclude from Growth Chart --     Most recent vital signs: Vitals:   07/17/23 1611  BP: (!) 135/97  Pulse: 60  Resp: 20  Temp: 99.1 F (37.3 C)  SpO2: 96%    General: Awake, no distress.  CV:  Good peripheral perfusion.  Resp:  Normal effort.  Abd:  No distention.  Other:  Tender to palpation along the posterior shoulder and left trapezius muscle, range of motion maintained but does elicit pain, 5/5 strength in bilateral upper extremities.  Sensation intact across all dermatomes.  Radial pulse 2+ and regular.   ED Results / Procedures / Treatments   Labs (all labs ordered are listed, but only abnormal results are displayed) Labs Reviewed - No data to display  RADIOLOGY  Left shoulder x-rays obtained, interpreted the images as well as reviewed the radiologist report which is negative for any acute abnormalities.   PROCEDURES:  Critical  Care performed: No  Procedures   MEDICATIONS ORDERED IN ED: Medications  dexamethasone (DECADRON) injection 10 mg (has no administration in time range)  ketorolac (TORADOL) 15 MG/ML injection 15 mg (has no administration in time range)     IMPRESSION / MDM / ASSESSMENT AND PLAN / ED COURSE  I reviewed the triage vital signs and the nursing notes.                             74-year-old male presents for evaluation of left shoulder pain and back pain.  Blood pressure was little elevated in triage otherwise vital signs stable.  Patient in pain on exam but NAD.  Differential diagnosis includes, but is not limited to, muscle strain, cervical radiculopathy, shoulder impingement, rotator cuff tear, labral tear.  Patient's presentation is most consistent with acute complicated illness / injury requiring diagnostic workup.  I interpreted the images as well as reviewed the radiologist report for the left shoulder x-rays which were negative.  I believe patient's presentation is most consistent with  a muscle strain.  He was given dexamethasone and Toradol while in the ED.  I also sent a prescription for meloxicam, muscle relaxer and a steroid taper.  He was given information for orthopedic follow-up and instructed to schedule an appointment if his symptoms do not improve with these medications.  Patient was agreeable to plan, voiced understanding and all questions were answered.  Stable at discharge.      FINAL CLINICAL IMPRESSION(S) / ED DIAGNOSES   Final diagnoses:  Acute pain of left shoulder     Rx / DC Orders   ED Discharge Orders          Ordered    cyclobenzaprine (FLEXERIL) 10 MG tablet  3 times daily PRN        07/17/23 1757    predniSONE (STERAPRED UNI-PAK 21 TAB) 10 MG (21) TBPK tablet        07/17/23 1757    meloxicam (MOBIC) 15 MG tablet  Daily        07/17/23 1757             Note:  This document was prepared using Dragon voice recognition software and may  include unintentional dictation errors.   Cameron Ali, PA-C 07/17/23 1853    Janith Lima, MD 07/17/23 Serena Croissant

## 2023-07-17 NOTE — ED Triage Notes (Signed)
Pt to ED c/o upper back pain and left shoulder pain x 1 week. No known injury.

## 2024-04-20 ENCOUNTER — Encounter: Admitting: Nurse Practitioner

## 2024-07-04 ENCOUNTER — Encounter: Payer: Self-pay | Admitting: Nurse Practitioner

## 2024-07-04 ENCOUNTER — Ambulatory Visit (INDEPENDENT_AMBULATORY_CARE_PROVIDER_SITE_OTHER): Admitting: Nurse Practitioner

## 2024-07-04 VITALS — BP 116/72 | HR 71 | Resp 18 | Ht 69.0 in | Wt 150.9 lb

## 2024-07-04 DIAGNOSIS — M545 Low back pain, unspecified: Secondary | ICD-10-CM

## 2024-07-04 LAB — POCT URINALYSIS DIPSTICK
Bilirubin, UA: NEGATIVE
Blood, UA: NEGATIVE
Glucose, UA: NEGATIVE
Ketones, UA: NEGATIVE
Leukocytes, UA: NEGATIVE
Nitrite, UA: NEGATIVE
Odor: NORMAL
Protein, UA: NEGATIVE
Spec Grav, UA: 1.015 (ref 1.010–1.025)
Urobilinogen, UA: 0.2 U/dL
pH, UA: 6 (ref 5.0–8.0)

## 2024-07-04 MED ORDER — TIZANIDINE HCL 4 MG PO TABS
4.0000 mg | ORAL_TABLET | Freq: Three times a day (TID) | ORAL | 0 refills | Status: AC | PRN
Start: 2024-07-04 — End: ?

## 2024-07-04 MED ORDER — NAPROXEN 500 MG PO TABS
500.0000 mg | ORAL_TABLET | Freq: Two times a day (BID) | ORAL | 0 refills | Status: AC
Start: 2024-07-04 — End: 2024-07-14

## 2024-07-04 NOTE — Progress Notes (Signed)
 BP 116/72   Pulse 71   Resp 18   Ht 5' 9 (1.753 m)   Wt 150 lb 14.4 oz (68.4 kg)   SpO2 97%   BMI 22.28 kg/m    Subjective:    Patient ID: Colton Hicks, male    DOB: 10-Oct-1991, 33 y.o.   MRN: [id number]  HPI: Colton Hicks is a 34 y.o. male presenting today for complaints of lower back pain that started 1 week ago but has worsened over the last 2 days.Colton Hicks describes the pain as a 5/10 on the pain scale and is dull and achy. Hicks reports heavy lifting twice a week at his job.The pain is worse in the morning after Hicks wakes up. Hicks has tried Tylenol  for pain relief with minimal improvement of symptoms. Hicks denies dysuria, urinary frequency, hematuria, or fever, but is concerned for a UTI. Patient denies any radiation of pain.           07/04/2024   11:29 AM 02/13/2022    3:47 PM  Depression screen PHQ 2/9  Decreased Interest 0 0  Down, Depressed, Hopeless 0 0  PHQ - 2 Score 0 0  Altered sleeping 0 0  Tired, decreased energy 0 0  Change in appetite 0 0  Feeling bad or failure about yourself  0 0  Trouble concentrating 0 0  Moving slowly or fidgety/restless 0 0  Suicidal thoughts 0 0  PHQ-9 Score 0 0  Difficult doing work/chores Not difficult at all Not difficult at all    Relevant past medical, surgical, family and social history reviewed and updated as indicated. Interim medical history since our last visit reviewed. Allergies and medications reviewed and updated.  Review of Systems  Ten systems reviewed and is negative except as mentioned in HPI      Objective:     BP 116/72   Pulse 71   Resp 18   Ht 5' 9 (1.753 m)   Wt 150 lb 14.4 oz (68.4 kg)   SpO2 97%   BMI 22.28 kg/m    Wt Readings from Last 3 Encounters:  07/04/24 150 lb 14.4 oz (68.4 kg)  07/17/23 145 lb (65.8 kg)  01/26/23 154 lb 5.2 oz (70 kg)    Physical Exam Constitutional:      Appearance: Normal appearance.  HENT:     Head: Normocephalic and atraumatic.  Abdominal:     General:  Abdomen is flat. Bowel sounds are normal.     Palpations: Abdomen is soft.     Tenderness: There is abdominal tenderness in the left lower quadrant. There is no right CVA tenderness or left CVA tenderness.  Musculoskeletal:     Cervical back: Normal range of motion and neck supple.  Skin:    General: Skin is warm and dry.  Neurological:     General: No focal deficit present.     Mental Status: Hicks is alert and oriented to person, place, and time.  Psychiatric:        Mood and Affect: Mood normal.        Behavior: Behavior normal.        Thought Content: Thought content normal.        Judgment: Judgment normal.      Results for orders placed or performed in visit on 07/04/24  POCT Urinalysis Dipstick   Collection Time: 07/04/24 12:00 PM  Result Value Ref Range   Color, UA yellow    Clarity, UA clear  Glucose, UA Negative Negative   Bilirubin, UA neg    Ketones, UA neg    Spec Grav, UA 1.015 1.010 - 1.025   Blood, UA neg    pH, UA 6.0 5.0 - 8.0   Protein, UA Negative Negative   Urobilinogen, UA 0.2 0.2 or 1.0 E.U./dL   Nitrite, UA neg    Leukocytes, UA Negative Negative   Appearance clear    Odor normal           Assessment & Plan:   Problem List Items Addressed This Visit   None Visit Diagnoses       Acute midline low back pain without sciatica    -  Primary   Urinalysis performed in office and was negative for leukocytes or nitrites. Rx sent in for Naproxen  and Zanaflex  PRN for low back pain   Relevant Medications   naproxen  (NAPROSYN ) 500 MG tablet   tiZANidine  (ZANAFLEX ) 4 MG tablet   Other Relevant Orders   POCT Urinalysis Dipstick (Completed)      Urinalysis was negative for leukocytes and nitrites.     Home care: -Advised rest and heat to lower back -Begin taking Naproxen  500 mg twice a day by mouth with meals -Begin taking Tizanidine  4 mg every 8 hours as needed for low back pain.  -Discussed light stretching.  -Provided a note to patient to  avoid heavy lifting at work for the next couple of days.  -If pain does not improve advised to return to office and discussed potential for physical therapy referral.         Follow up plan: Return if symptoms worsen or fail to improve.   I have reviewed this encounter including the documentation in this note and/or discussed this patient with the provider, Aislinn Womack, SNP, I am certifying that I agree with the content of this note as supervising/preceptor nurse practitioner.  Mliss Spray, FNP-C [facility] 07/04/2024, 12:12 PM

## 2024-07-26 DIAGNOSIS — A749 Chlamydial infection, unspecified: Secondary | ICD-10-CM | POA: Insufficient documentation

## 2024-07-26 DIAGNOSIS — R10A0 Flank pain, unspecified side: Secondary | ICD-10-CM | POA: Diagnosis present

## 2024-07-27 ENCOUNTER — Encounter: Payer: Self-pay | Admitting: Emergency Medicine

## 2024-07-27 ENCOUNTER — Other Ambulatory Visit: Payer: Self-pay

## 2024-07-27 ENCOUNTER — Emergency Department
Admission: EM | Admit: 2024-07-27 | Discharge: 2024-07-27 | Disposition: A | Discharge status: Home / Self Care | Attending: Emergency Medicine | Admitting: Emergency Medicine

## 2024-07-27 DIAGNOSIS — A749 Chlamydial infection, unspecified: Secondary | ICD-10-CM

## 2024-07-27 LAB — URINALYSIS, ROUTINE W REFLEX MICROSCOPIC
Bilirubin Urine: NEGATIVE
Glucose, UA: NEGATIVE mg/dL
Hgb urine dipstick: NEGATIVE
Ketones, ur: NEGATIVE mg/dL
Nitrite: NEGATIVE
Protein, ur: 30 mg/dL — AB
Specific Gravity, Urine: 1.03 (ref 1.005–1.030)
WBC, UA: >50 WBC/hpf (ref 0–5)
pH: 6 (ref 5.0–8.0)

## 2024-07-27 LAB — CBC WITH DIFFERENTIAL/PLATELET
Abs Immature Granulocytes: 0.01 K/uL (ref 0.00–0.07)
Basophils Absolute: 0 K/uL (ref 0.0–0.1)
Basophils Relative: 1 %
Eosinophils Absolute: 0.2 K/uL (ref 0.0–0.5)
Eosinophils Relative: 3 %
HCT: 44.3 % (ref 39.0–52.0)
Hemoglobin: 14.7 g/dL (ref 13.0–17.0)
Immature Granulocytes: 0 %
Lymphocytes Relative: 29 %
Lymphs Abs: 1.9 K/uL (ref 0.7–4.0)
MCH: 28.3 pg (ref 26.0–34.0)
MCHC: 33.2 g/dL (ref 30.0–36.0)
MCV: 85.4 fL (ref 80.0–100.0)
Monocytes Absolute: 0.5 K/uL (ref 0.1–1.0)
Monocytes Relative: 7 %
Neutro Abs: 4 K/uL (ref 1.7–7.7)
Neutrophils Relative %: 60 %
Platelets: 215 K/uL (ref 150–400)
RBC: 5.19 MIL/uL (ref 4.22–5.81)
RDW: 11.9 % (ref 11.5–15.5)
WBC: 6.6 K/uL (ref 4.0–10.5)
nRBC: 0 % (ref 0.0–0.2)

## 2024-07-27 LAB — COMPREHENSIVE METABOLIC PANEL WITH GFR
ALT: 25 U/L (ref 0–44)
AST: 23 U/L (ref 15–41)
Albumin: 4.6 g/dL (ref 3.5–5.0)
Alkaline Phosphatase: 38 U/L (ref 38–126)
Anion gap: 14 (ref 5–15)
BUN: 13 mg/dL (ref 6–20)
CO2: 24 mmol/L (ref 22–32)
Calcium: 9.1 mg/dL (ref 8.9–10.3)
Chloride: 104 mmol/L (ref 98–111)
Creatinine, Ser: 1.09 mg/dL (ref 0.61–1.24)
GFR, Estimated: >60 mL/min (ref >60)
Glucose, Bld: 95 mg/dL (ref 70–99)
Potassium: 3.5 mmol/L (ref 3.5–5.1)
Sodium: 142 mmol/L (ref 135–145)
Total Bilirubin: 0.4 mg/dL (ref 0.0–1.2)
Total Protein: 6.9 g/dL (ref 6.5–8.1)

## 2024-07-27 LAB — CHLAMYDIA/NGC RT PCR (ARMC ONLY)
Chlamydia Tr: DETECTED — AB
N gonorrhoeae: NOT DETECTED

## 2024-07-27 MED ORDER — CEFTRIAXONE SODIUM 1 G IJ SOLR
500.0000 mg | Freq: Once | INTRAMUSCULAR | Status: AC
  Administered 2024-07-27: 500 mg via INTRAMUSCULAR
  Filled 2024-07-27: qty 10

## 2024-07-27 MED ORDER — DOXYCYCLINE HYCLATE 100 MG PO TABS: 100.0000 mg | ORAL_TABLET | Freq: Two times a day (BID) | ORAL | 0 refills | Status: AC

## 2024-07-27 MED ORDER — DOXYCYCLINE HYCLATE 100 MG PO TABS
100.0000 mg | ORAL_TABLET | Freq: Once | ORAL | Status: AC
  Administered 2024-07-27: 100 mg via ORAL
  Filled 2024-07-27: qty 1

## 2024-07-27 NOTE — ED Triage Notes (Signed)
 Patient ambulatory to triage with steady gait, without difficulty or distress noted; pt reports having dysuria accomp by lower back pain, mostly on left side

## 2024-07-27 NOTE — Discharge Instructions (Addendum)
 Avoid sex for 7 days Any partners in the previous 60 days should all be notified and tested and treated. You should be rescreened with your primary care doctor in 3 months to ensure resolution of symptoms  You should go to the health department to get screening for HIV, syphilis.

## 2024-07-27 NOTE — ED Notes (Signed)
 Pt discharged to home, instructions and medications reviewed.  Pt verbalized understanding, no questions at this time.

## 2024-07-27 NOTE — ED Provider Notes (Signed)
 [facility] Provider Note    Event Date/Time   First MD Initiated Contact with Patient 07/27/24 0234     (approximate)   History   Flank Pain   HPI  Boy Hardie Veltre is a 33 y.o. male who is otherwise healthy who comes in with concerns for pain with urination.  Patient reports pain with urination over the past 3 days.  He reports no discharge or lesions of his penis.  He reports only being sexually active with the same partner for the past 7 years.  He does report some occasional left flank pain but it is just random and very mild in nature.  He denies any pain right now in his back or his abdomen.  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     Physical Exam   Triage Vital Signs: ED Triage Vitals  Encounter Vitals Group     BP 07/27/24 0001 136/82     Girls Systolic BP Percentile --      Girls Diastolic BP Percentile --      Boys Systolic BP Percentile --      Boys Diastolic BP Percentile --      Pulse Rate 07/27/24 0001 80     Resp 07/27/24 0001 20     Temp 07/27/24 0001 98.4 F (36.9 C)     Temp Source 07/27/24 0001 Oral     SpO2 07/27/24 0001 97 %     Weight 07/27/24 0000 150 lb (68 kg)     Height 07/27/24 0000 5' 8 (1.727 m)     Head Circumference --      Peak Flow --      Pain Score 07/27/24 0000 10     Pain Loc --      Pain Education --      Exclude from Growth Chart --     Most recent vital signs: Vitals:   07/27/24 0001  BP: 136/82  Pulse: 80  Resp: 20  Temp: 98.4 F (36.9 C)  SpO2: 97%     General: Awake, no distress.  CV:  Good peripheral perfusion.  Resp:  Normal effort.  Abd:  No distention.  Soft and nontender Other:  No CVA tenderness   ED Results / Procedures / Treatments   Labs (all labs ordered are listed, but only abnormal results are displayed) Labs Reviewed  CHLAMYDIA/NGC RT PCR ([facility] ONLY)           - Abnormal; Notable for the following components:      Result Value   Chlamydia Tr DETECTED (*)     All other components within normal limits  URINALYSIS, ROUTINE W REFLEX MICROSCOPIC - Abnormal; Notable for the following components:   Color, Urine YELLOW (*)    APPearance HAZY (*)    Protein, ur 30 (*)    Leukocytes,Ua SMALL (*)    Bacteria, UA FEW (*)    All other components within normal limits  CBC WITH DIFFERENTIAL/PLATELET  COMPREHENSIVE METABOLIC PANEL WITH GFR     PROCEDURES:  Critical Care performed: No  Procedures   MEDICATIONS ORDERED IN ED: Medications - No data to display   IMPRESSION / MDM / ASSESSMENT AND PLAN / ED COURSE  I reviewed the triage vital signs and the nursing notes.   Patient's presentation is most consistent with acute presentation with potential threat to life or bodily function.   Differential includes UTI, STD, kidney stones.  Urine is concerning for UTI but given patient's  age STD screening was done.  CBC is reassuring with normal white count.  CMP is reassuring with normal kidney function.    + Chylamydia   Discussed with patient his urine will be sent for culture to ensure no other bacteria in it but I suspect that the chlamydia is what is causing it to look positive.  His exam does not seem consistent with pyelonephritis.  Patient understands to keep his phone on him as we would call him to start him on antibiotics for UTI in addition to the chlamydia if necessary.  He expressed understanding.  We discussed CT imaging to rule out kidney stone given he did report some intermittent flank pain but he reports it is very minimal in nature and he denies any pain currently and does not want to undergo CT imaging.  We discussed avoiding sex and letting his partner know to be treated.  He expressed understanding.  Offered to do genital exam to ensure no other penile lesions but patient declined stating that he did not have any.  Offered to do HIV and syphilis testing but patient states that he cannot stay any longer and he needed to go.  We did  discuss that he can have this done at the health department he expressed understanding     FINAL CLINICAL IMPRESSION(S) / ED DIAGNOSES   Final diagnoses:  Chlamydia     Rx / DC Orders   ED Discharge Orders          Ordered    doxycycline  (VIBRA -TABS) 100 MG tablet  2 times daily        07/27/24 9682             Note:  This document was prepared using Dragon voice recognition software and may include unintentional dictation errors.   Ernest Ronal BRAVO, MD 07/27/24 [phone]

## 2024-07-28 ENCOUNTER — Ambulatory Visit: Admitting: Nurse Practitioner

## 2024-07-28 LAB — URINE CULTURE: Culture: NO GROWTH

## 2024-08-07 ENCOUNTER — Encounter: Payer: Self-pay | Admitting: Nurse Practitioner

## 2024-08-07 ENCOUNTER — Ambulatory Visit (INDEPENDENT_AMBULATORY_CARE_PROVIDER_SITE_OTHER): Admitting: Nurse Practitioner

## 2024-08-07 ENCOUNTER — Other Ambulatory Visit (HOSPITAL_COMMUNITY)
Admission: RE | Admit: 2024-08-07 | Discharge: 2024-08-07 | Disposition: A | Discharge status: Home / Self Care | Source: Ambulatory Visit | Attending: Nurse Practitioner | Admitting: Nurse Practitioner

## 2024-08-07 VITALS — BP 128/86 | HR 63 | Temp 98.0°F | Ht 68.0 in | Wt 152.0 lb

## 2024-08-07 DIAGNOSIS — A749 Chlamydial infection, unspecified: Secondary | ICD-10-CM | POA: Diagnosis not present

## 2024-08-07 DIAGNOSIS — Z113 Encounter for screening for infections with a predominantly sexual mode of transmission: Secondary | ICD-10-CM | POA: Diagnosis not present

## 2024-08-07 DIAGNOSIS — B372 Candidiasis of skin and nail: Secondary | ICD-10-CM

## 2024-08-07 DIAGNOSIS — Z23 Encounter for immunization: Secondary | ICD-10-CM

## 2024-08-07 MED ORDER — NYSTATIN-TRIAMCINOLONE 100000-0.1 UNIT/GM-% EX OINT: 1.0000 | TOPICAL_OINTMENT | Freq: Two times a day (BID) | CUTANEOUS | 1 refills | Status: AC

## 2024-08-07 NOTE — Progress Notes (Signed)
 BP 128/86   Pulse 63   Temp 98 F (36.7 C)   Ht 5' 8 (1.727 m)   Wt 152 lb (68.9 kg)   SpO2 98%   BMI 23.11 kg/m    Subjective:    Patient ID: Colton Hicks, male    DOB: [birth year], 33 y.o.   MRN: [id number]  HPI: Colton Hicks is a 33 y.o. male  Chief Complaint  Patient presents with   Follow-up   Discussed the use of AI scribe software for clinical note transcription with the patient, who gave verbal consent to proceed.  History of Present Illness Colton Hicks is a 33 year old male who presents for follow-up after an ER visit for flank pain.  Flank pain and dysuria - Evaluated in the emergency department on July 27, 2024, for flank pain - Experienced dysuria for three days prior to the ER visit - No penile discharge or lesions - Occasional, mild left flank pain occurs randomly - No current symptoms; flank pain and dysuria have resolved  Sexually transmitted infection - Chlamydia test positive during ER visit; gonorrhea negative - Urinalysis showed bacteria, leukocytes, and protein; urine culture negative - CBC and CMP were normal - Treated with doxycycline  100 mg twice daily - No current symptoms; feels better overall - Sexually active with the same partner for the past seven years  Recurrent groin candidiasis - History of recurrent yeast infections in the groin area - Last episode occurred in 2023 - Previously treated with nystatin  and Kenalog ointment, applied twice daily to the affected area         07/04/2024   11:29 AM 02/13/2022    3:47 PM  Depression screen PHQ 2/9  Decreased Interest 0 0  Down, Depressed, Hopeless 0 0  PHQ - 2 Score 0 0  Altered sleeping 0 0  Tired, decreased energy 0 0  Change in appetite 0 0  Feeling bad or failure about yourself  0 0  Trouble concentrating 0 0  Moving slowly or fidgety/restless 0 0  Suicidal thoughts 0 0  PHQ-9 Score 0 0  Difficult doing work/chores Not difficult at all Not difficult at  all    Relevant past medical, surgical, family and social history reviewed and updated as indicated. Interim medical history since our last visit reviewed. Allergies and medications reviewed and updated.  Review of Systems  Ten systems reviewed and is negative except as mentioned in HPI      Objective:      BP 128/86   Pulse 63   Temp 98 F (36.7 C)   Ht 5' 8 (1.727 m)   Wt 152 lb (68.9 kg)   SpO2 98%   BMI 23.11 kg/m    Wt Readings from Last 3 Encounters:  08/07/24 152 lb (68.9 kg)  07/27/24 150 lb (68 kg)  07/04/24 150 lb 14.4 oz (68.4 kg)    Physical Exam GENERAL: Alert, cooperative, well developed, no acute distress HEENT: Normocephalic, normal oropharynx, moist mucous membranes CHEST: Clear to auscultation bilaterally, No wheezes, rhonchi, or crackles CARDIOVASCULAR: Normal heart rate and rhythm, S1 and S2 normal without murmurs ABDOMEN: Soft, non-tender, non-distended, without organomegaly, Normal bowel sounds EXTREMITIES: No cyanosis or edema NEUROLOGICAL: Cranial nerves grossly intact, Moves all extremities without gross motor or sensory deficit  Results for orders placed or performed during the hospital encounter of 07/27/24  CBC with Differential   Collection Time: 07/27/24 12:01 AM  Result Value Ref Range   WBC  6.6 4.0 - 10.5 K/uL   RBC 5.19 4.22 - 5.81 MIL/uL   Hemoglobin 14.7 13.0 - 17.0 g/dL   HCT 55.6 60.9 - 47.9 %   MCV 85.4 80.0 - 100.0 fL   MCH 28.3 26.0 - 34.0 pg   MCHC 33.2 30.0 - 36.0 g/dL   RDW 88.0 88.4 - 84.4 %   Platelets 215 150 - 400 K/uL   nRBC 0.0 0.0 - 0.2 %   Neutrophils Relative % 60 %   Neutro Abs 4.0 1.7 - 7.7 K/uL   Lymphocytes Relative 29 %   Lymphs Abs 1.9 0.7 - 4.0 K/uL   Monocytes Relative 7 %   Monocytes Absolute 0.5 0.1 - 1.0 K/uL   Eosinophils Relative 3 %   Eosinophils Absolute 0.2 0.0 - 0.5 K/uL   Basophils Relative 1 %   Basophils Absolute 0.0 0.0 - 0.1 K/uL   Immature Granulocytes 0 %   Abs Immature  Granulocytes 0.01 0.00 - 0.07 K/uL  Comprehensive metabolic panel   Collection Time: 07/27/24 12:01 AM  Result Value Ref Range   Sodium 142 135 - 145 mmol/L   Potassium 3.5 3.5 - 5.1 mmol/L   Chloride 104 98 - 111 mmol/L   CO2 24 22 - 32 mmol/L   Glucose, Bld 95 70 - 99 mg/dL   BUN 13 6 - 20 mg/dL   Creatinine, Ser 8.90 0.61 - 1.24 mg/dL   Calcium 9.1 8.9 - 89.6 mg/dL   Total Protein 6.9 6.5 - 8.1 g/dL   Albumin 4.6 3.5 - 5.0 g/dL   AST 23 15 - 41 U/L   ALT 25 0 - 44 U/L   Alkaline Phosphatase 38 38 - 126 U/L   Total Bilirubin 0.4 0.0 - 1.2 mg/dL   GFR, Estimated >39 >39 mL/min   Anion gap 14 5 - 15  Chlamydia/NGC rt PCR ([facility] only)   Collection Time: 07/27/24 12:11 AM   Specimen: Urine, Clean Catch  Result Value Ref Range   Specimen source GC/Chlam URINE, RANDOM    Chlamydia Tr DETECTED (A) NOT DETECTED   N gonorrhoeae NOT DETECTED NOT DETECTED  Urine Culture   Collection Time: 07/27/24 12:11 AM   Specimen: Urine, Clean Catch  Result Value Ref Range   Specimen Description      URINE, CLEAN CATCH Performed at [facility], [address], [place], [place] [address]    Special Requests      NONE Performed at [facility], [address] [place], [place] [address]    Culture      NO GROWTH Performed at [facility], [address] [place], [place] [address]    Report Status 07/28/2024 FINAL   Urinalysis, Routine w reflex microscopic -Urine, Clean Catch   Collection Time: 07/27/24 12:11 AM  Result Value Ref Range   Color, Urine YELLOW (A) YELLOW   APPearance HAZY (A) CLEAR   Specific Gravity, Urine 1.030 1.005 - 1.030   pH 6.0 5.0 - 8.0   Glucose, UA NEGATIVE NEGATIVE mg/dL   Hgb urine dipstick NEGATIVE NEGATIVE   Bilirubin Urine NEGATIVE NEGATIVE   Ketones, ur NEGATIVE NEGATIVE mg/dL   Protein, ur 30 (A) NEGATIVE mg/dL   Nitrite NEGATIVE NEGATIVE   Leukocytes,Ua SMALL (A) NEGATIVE   RBC / HPF 11-20 0 - 5 RBC/hpf   WBC, UA >50 0 - 5  WBC/hpf   Bacteria, UA FEW (A) NONE SEEN   Squamous Epithelial / HPF 0-5 0 - 5 /  HPF   Mucus PRESENT           Assessment & Plan:   Problem List Items Addressed This Visit   None Visit Diagnoses       Immunization due    -  Primary     Chlamydia       Relevant Medications   nystatin -triamcinolone  ointment (MYCOLOG)   Other Relevant Orders   Urine cytology ancillary only     Screening for STD (sexually transmitted disease)       Relevant Orders   Hepatitis C antibody   HIV Antibody (routine testing w rflx)   RPR     Yeast infection of the skin       Relevant Medications   nystatin -triamcinolone  ointment (MYCOLOG)        Assessment and Plan Assessment & Plan Chlamydial infection of lower genitourinary tract, resolved, status post treatment Chlamydial infection of the lower genitourinary tract was diagnosed in the emergency department. Symptoms included flank pain and dysuria. Gonorrhea was ruled out. Urinalysis showed bacteria, leukocytes, and protein, but urine culture was negative. He was treated with doxycycline  100 mg twice daily. Currently asymptomatic and seeking confirmation of infection clearance. - Order urine test for gonorrhea and chlamydia - Order blood tests for HIV, hepatitis C, and syphilis  Recurrent intertrigo (candidiasis of skin) Recurrent intertrigo, last episode in 2023, located between the legs. Previously treated with nystatin -Kenalog ointment. - Prescribe nystatin -Kenalog ointment, apply twice daily to affected area        Follow up plan: Return if symptoms worsen or fail to improve.

## 2024-08-08 LAB — HEPATITIS C ANTIBODY: Hepatitis C Ab: NONREACTIVE

## 2024-08-08 LAB — HIV ANTIBODY (ROUTINE TESTING W REFLEX)
HIV 1&2 Ab, 4th Generation: NONREACTIVE
HIV FINAL INTERPRETATION: NEGATIVE

## 2024-08-08 LAB — RPR: RPR Ser Ql: NONREACTIVE

## 2024-08-09 ENCOUNTER — Ambulatory Visit: Payer: Self-pay | Admitting: Nurse Practitioner

## 2024-08-09 LAB — URINE CYTOLOGY ANCILLARY ONLY
Chlamydia: POSITIVE — AB
Comment: NEGATIVE
Comment: NORMAL
Neisseria Gonorrhea: NEGATIVE
